# Patient Record
Sex: Female | Born: 1945 | Race: Black or African American | Hispanic: No | Marital: Single | Smoking: Current some day smoker
Health system: Southern US, Community
[De-identification: ages and names within clinical notes are randomized; demographics above are authoritative.]

## PROBLEM LIST (undated history)

## (undated) DIAGNOSIS — E559 Vitamin D deficiency, unspecified: Secondary | ICD-10-CM

## (undated) DIAGNOSIS — M1990 Unspecified osteoarthritis, unspecified site: Secondary | ICD-10-CM

## (undated) HISTORY — PX: WISDOM TOOTH EXTRACTION: SHX21

## (undated) HISTORY — DX: Vitamin D deficiency, unspecified: E55.9

---

## 2003-05-02 ENCOUNTER — Emergency Department (HOSPITAL_COMMUNITY): Admission: AD | Admit: 2003-05-02 | Discharge: 2003-05-02 | Payer: Self-pay | Admitting: Family Medicine

## 2004-05-15 ENCOUNTER — Emergency Department (HOSPITAL_COMMUNITY): Admission: EM | Admit: 2004-05-15 | Discharge: 2004-05-15 | Payer: Self-pay | Admitting: Family Medicine

## 2004-11-07 ENCOUNTER — Emergency Department (HOSPITAL_COMMUNITY): Admission: EM | Admit: 2004-11-07 | Discharge: 2004-11-07 | Payer: Self-pay | Admitting: Emergency Medicine

## 2008-01-26 ENCOUNTER — Emergency Department (HOSPITAL_COMMUNITY): Admission: EM | Admit: 2008-01-26 | Discharge: 2008-01-26 | Payer: Self-pay | Admitting: Emergency Medicine

## 2010-01-09 ENCOUNTER — Emergency Department (HOSPITAL_COMMUNITY)
Admission: EM | Admit: 2010-01-09 | Discharge: 2010-01-09 | Payer: Self-pay | Source: Home / Self Care | Admitting: Family Medicine

## 2010-04-12 ENCOUNTER — Inpatient Hospital Stay (INDEPENDENT_AMBULATORY_CARE_PROVIDER_SITE_OTHER)
Admission: RE | Admit: 2010-04-12 | Discharge: 2010-04-12 | Disposition: A | Discharge status: Home / Self Care | Payer: Medicare Other | Source: Ambulatory Visit | Attending: Family Medicine | Admitting: Family Medicine

## 2010-04-12 DIAGNOSIS — L03019 Cellulitis of unspecified finger: Secondary | ICD-10-CM

## 2010-04-12 DIAGNOSIS — T23029A Burn of unspecified degree of unspecified single finger (nail) except thumb, initial encounter: Secondary | ICD-10-CM

## 2010-04-15 ENCOUNTER — Inpatient Hospital Stay (INDEPENDENT_AMBULATORY_CARE_PROVIDER_SITE_OTHER)
Admission: RE | Admit: 2010-04-15 | Discharge: 2010-04-15 | Disposition: A | Discharge status: Home / Self Care | Payer: Medicare Other | Source: Ambulatory Visit | Attending: Family Medicine | Admitting: Family Medicine

## 2010-04-15 DIAGNOSIS — L0291 Cutaneous abscess, unspecified: Secondary | ICD-10-CM

## 2010-04-15 DIAGNOSIS — T23029A Burn of unspecified degree of unspecified single finger (nail) except thumb, initial encounter: Secondary | ICD-10-CM

## 2012-10-01 ENCOUNTER — Encounter (HOSPITAL_COMMUNITY): Payer: Self-pay

## 2012-10-01 ENCOUNTER — Emergency Department (INDEPENDENT_AMBULATORY_CARE_PROVIDER_SITE_OTHER)
Admission: EM | Admit: 2012-10-01 | Discharge: 2012-10-01 | Disposition: A | Discharge status: Home / Self Care | Payer: Medicare Other | Source: Home / Self Care

## 2012-10-01 DIAGNOSIS — H6120 Impacted cerumen, unspecified ear: Secondary | ICD-10-CM

## 2012-10-01 DIAGNOSIS — H6121 Impacted cerumen, right ear: Secondary | ICD-10-CM

## 2012-10-01 NOTE — ED Provider Notes (Signed)
Medical screening examination/treatment/procedure(s) were performed by non-physician practitioner and as supervising physician I was immediately available for consultation/collaboration.  Abagael Kramm, M.D.  Jenee Spaugh C Aneyah Lortz, MD 10/01/12 1720 

## 2012-10-01 NOTE — ED Notes (Signed)
Episodic ear pain and fullness for several years; NAD

## 2012-10-01 NOTE — ED Provider Notes (Signed)
CSN: [id number]     Arrival date & time 10/01/12  1421 History   First MD Initiated Contact with Patient 10/01/12 1518     Chief Complaint  Patient presents with  . Otalgia   (Consider location/radiation/quality/duration/timing/severity/associated sxs/prior Treatment) HPI Comments: 67 year old female with a history of recurrent ear wax impaction in the right ear presents with complaints of decreased hearing. She states that she has to go to a medical provider every one or more years to clean out her right year. It associated with minor discomfort and some numbness around the ear. The onset of decreased hearing began about one week ago. She denies dizziness, headache or problems with balance.   History reviewed. No pertinent past medical history. History reviewed. No pertinent past surgical history. History reviewed. No pertinent family history. History  Substance Use Topics  . Smoking status: Current Every Day Smoker  . Smokeless tobacco: Not on file  . Alcohol Use: Not on file   OB History   Grav Para Term Preterm Abortions TAB SAB Ect Mult Living                 Review of Systems  HENT: Positive for hearing loss. Negative for congestion, rhinorrhea, mouth sores, trouble swallowing, neck pain, postnasal drip, sinus pressure, tinnitus and ear discharge.   Neurological: Negative for dizziness, tremors, syncope, facial asymmetry, speech difficulty, weakness, light-headedness and headaches.  All other systems reviewed and are negative.    Allergies  Review of patient's allergies indicates no known allergies.  Home Medications  No current outpatient prescriptions on file. BP 134/88  Pulse 88  Temp(Src) 98.4 F (36.9 C) (Oral)  Resp 19  SpO2 96% Physical Exam  Nursing note and vitals reviewed. Constitutional: She is oriented to person, place, and time. She appears well-developed and well-nourished. No distress.  HENT:  Mouth/Throat: Oropharynx is clear and moist. No  oropharyngeal exudate.  Bilateral EACs are obscured with cerumen. No drainage. No tenderness with pulling on the outer ear. No surrounding erythema.  Eyes: Conjunctivae and EOM are normal.  Neck: Normal range of motion. Neck supple.  Cardiovascular: Normal rate.   Pulmonary/Chest: Effort normal. No respiratory distress.  Lymphadenopathy:    She has no cervical adenopathy.  Neurological: She is alert and oriented to person, place, and time. She exhibits normal muscle tone.  Skin: Skin is warm and dry. No rash noted.    ED Course  Procedures (including critical care time) Labs Review Labs Reviewed - No data to display Imaging Review No results found.  MDM   1. Cerumen impaction, right      Post irrigation the EAC is clear. TM minor erythema. St she can now hear normally.  F/U with PCP as needed.   Hayden Rasmussen, NP 10/01/12 1626

## 2013-10-03 ENCOUNTER — Encounter (HOSPITAL_COMMUNITY): Payer: Self-pay | Admitting: Emergency Medicine

## 2013-10-03 ENCOUNTER — Emergency Department (INDEPENDENT_AMBULATORY_CARE_PROVIDER_SITE_OTHER)
Admission: EM | Admit: 2013-10-03 | Discharge: 2013-10-03 | Disposition: A | Discharge status: Home / Self Care | Payer: Medicare Other | Source: Home / Self Care | Attending: Family Medicine | Admitting: Family Medicine

## 2013-10-03 DIAGNOSIS — H6120 Impacted cerumen, unspecified ear: Secondary | ICD-10-CM

## 2013-10-03 DIAGNOSIS — H6123 Impacted cerumen, bilateral: Secondary | ICD-10-CM

## 2013-10-03 NOTE — ED Notes (Signed)
Pt  Reports       Symptoms  Of  Ears   Clogged  For   sev   Weeks    denys       Any other  Symptoms    States  It happens   Every  Year

## 2013-10-03 NOTE — ED Provider Notes (Signed)
CSN: [id number]     Arrival date & time 10/03/13  1046 History   First MD Initiated Contact with Patient 10/03/13 1053     Chief Complaint  Patient presents with  . Cerumen Impaction   (Consider location/radiation/quality/duration/timing/severity/associated sxs/prior Treatment) Patient is a 68 y.o. female presenting with plugged ear sensation. The history is provided by the patient.  Ear Fullness This is a recurrent problem. The current episode started more than 2 days ago. The problem has not changed since onset.Pertinent negatives include no chest pain, no abdominal pain, no headaches and no shortness of breath. Associated symptoms comments: H/o similar problem with cerumen once or twice a year.Marland Kitchen    History reviewed. No pertinent past medical history. History reviewed. No pertinent past surgical history. History reviewed. No pertinent family history. History  Substance Use Topics  . Smoking status: Current Every Day Smoker  . Smokeless tobacco: Not on file  . Alcohol Use: Not on file   OB History   Grav Para Term Preterm Abortions TAB SAB Ect Mult Living                 Review of Systems  Constitutional: Negative.   HENT: Positive for ear pain. Negative for ear discharge and facial swelling.   Respiratory: Negative for shortness of breath.   Cardiovascular: Negative for chest pain.  Gastrointestinal: Negative for abdominal pain.  Neurological: Negative for headaches.    Allergies  Review of patient's allergies indicates no known allergies.  Home Medications   Prior to Admission medications   Not on File   BP 134/86  Pulse 78  Temp(Src) 98.6 F (37 C) (Oral)  Resp 18  SpO2 99% Physical Exam  Nursing note and vitals reviewed. Constitutional: She is oriented to person, place, and time. She appears well-developed and well-nourished.  HENT:  Right Ear: External ear normal.  Left Ear: External ear normal.  Nose: Nose normal.  Mouth/Throat: Oropharynx is clear and  moist.  Eyes: Conjunctivae and EOM are normal. Pupils are equal, round, and reactive to light.  Neck: Normal range of motion. Neck supple.  Lymphadenopathy:    She has no cervical adenopathy.  Neurological: She is alert and oriented to person, place, and time.  Skin: Skin is warm and dry.    ED Course  Procedures (including critical care time) Labs Review Labs Reviewed - No data to display  Imaging Review No results found.   MDM   1. Cerumen impaction, bilateral    Sx improved after irrig, tm nl bilat., canals clear.    Billy Fischer, MD 10/03/13 [phone]

## 2014-11-08 ENCOUNTER — Emergency Department (HOSPITAL_COMMUNITY)
Admission: EM | Admit: 2014-11-08 | Discharge: 2014-11-08 | Disposition: A | Discharge status: Home / Self Care | Payer: Medicare Other | Attending: Emergency Medicine | Admitting: Emergency Medicine

## 2014-11-08 ENCOUNTER — Emergency Department (HOSPITAL_COMMUNITY): Payer: Medicare Other

## 2014-11-08 ENCOUNTER — Encounter (HOSPITAL_COMMUNITY): Payer: Self-pay | Admitting: Emergency Medicine

## 2014-11-08 DIAGNOSIS — Z720 Tobacco use: Secondary | ICD-10-CM | POA: Diagnosis not present

## 2014-11-08 DIAGNOSIS — Y9389 Activity, other specified: Secondary | ICD-10-CM | POA: Insufficient documentation

## 2014-11-08 DIAGNOSIS — X58XXXA Exposure to other specified factors, initial encounter: Secondary | ICD-10-CM | POA: Diagnosis not present

## 2014-11-08 DIAGNOSIS — Y998 Other external cause status: Secondary | ICD-10-CM | POA: Insufficient documentation

## 2014-11-08 DIAGNOSIS — Y9289 Other specified places as the place of occurrence of the external cause: Secondary | ICD-10-CM | POA: Diagnosis not present

## 2014-11-08 DIAGNOSIS — S8992XA Unspecified injury of left lower leg, initial encounter: Secondary | ICD-10-CM | POA: Diagnosis not present

## 2014-11-08 DIAGNOSIS — M179 Osteoarthritis of knee, unspecified: Secondary | ICD-10-CM | POA: Diagnosis not present

## 2014-11-08 DIAGNOSIS — M1710 Unilateral primary osteoarthritis, unspecified knee: Secondary | ICD-10-CM

## 2014-11-08 DIAGNOSIS — M1610 Unilateral primary osteoarthritis, unspecified hip: Secondary | ICD-10-CM

## 2014-11-08 MED ORDER — MELOXICAM 7.5 MG PO TABS: 7.5000 mg | ORAL_TABLET | Freq: Every day | ORAL | Status: AC | PRN

## 2014-11-08 NOTE — ED Notes (Signed)
Per pt, states left knee pain on and off for weeks-states it sometimes starts in groin and radiates to left knee

## 2014-11-08 NOTE — ED Provider Notes (Signed)
CSN: [id number]     Arrival date & time 11/08/14  1147 History  By signing my name below, I, Cheyenne Lucas, attest that this documentation has been prepared under the direction and in the presence of [facility], PA-C. Electronically Signed: Sonum Lucas, Education administrator. 11/08/2014. 12:36 PM.    Chief Complaint  Patient presents with  . Knee Pain   The history is provided by the patient. No language interpreter was used.     HPI Comments: Cheyenne Lucas is a 69 y.o. female who presents to the Emergency Department complaining of intermittent left knee pain for the past 4 weeks. She states the pain worsens with walking and certain movements; states it occasionally makes a "popping" noise. She reports helping her mother out of the car yesterday which also aggravated her pain. 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 She is unsure if the knee and hip pain are related. She denies prior history of PE/DVT. She denies taking hormone replacement therapy, recent long distance travel, or recent immobilizations. She denies weakness, numbness, calf swelling.   History reviewed. No pertinent past medical history. History reviewed. No pertinent past surgical history. No family history on file. Social History  Substance Use Topics  . Smoking status: Current Every Day Smoker  . Smokeless tobacco: None  . Alcohol Use: No   OB History    No data available     Review of Systems  Constitutional: Negative for fever.  Cardiovascular: Negative for leg swelling.  Musculoskeletal: Positive for arthralgias (left hip pain, left knee pain). Negative for myalgias and joint swelling.  Skin: Negative for color change, pallor and wound.  Allergic/Immunologic: Negative for immunocompromised state.  Neurological: Negative for weakness and numbness.  Hematological: Does not bruise/bleed  easily.  Psychiatric/Behavioral: Negative for self-injury.      Allergies  Review of patient's allergies indicates no known allergies.  Home Medications   Prior to Admission medications   Not on File   BP 132/90 mmHg  Pulse 92  Temp(Src) 98.2 F (36.8 C) (Oral)  Resp 18  SpO2 100% Physical Exam  Constitutional: She appears well-developed and well-nourished. No distress.  HENT:  Head: Normocephalic and atraumatic.  Neck: Neck supple.  Cardiovascular: Normal rate.   Pulmonary/Chest: Effort normal.  Musculoskeletal:  Full passive ROM of the left hip and left knee. Pain with abduction of hip.  No laxity of knee joint or pain with movement. No erythema, edema, or warmth. Lower extremities:  Strength 5/5, sensation intact, distal pulses intact.     Neurological: She is alert.  Skin: She is not diaphoretic.  Nursing note and vitals reviewed.   ED Course  Procedures (including critical care time)  DIAGNOSTIC STUDIES: Oxygen Saturation is 100% on RA, normal by my interpretation.    COORDINATION OF CARE: 12:36 PM Discussed treatment plan with pt at bedside and pt agreed to plan.  12:46 PM Dr Eulis Foster made aware of patient, he will also see and examine the patient.    12:55 PM Discussed the case with Dr. Eulis Foster, please see his note for details.    Labs Review Labs Reviewed - No data to display  Imaging Review Dg Knee Complete 4 Views Left  11/08/2014  CLINICAL DATA:  Anterior posterior left knee pain after getting out of bed 26 days ago. EXAM: LEFT KNEE - COMPLETE 4+ VIEW  COMPARISON:  None. FINDINGS: Minimal spur formation involving all 3 joint compartments. No fracture, dislocation or effusion. IMPRESSION: Minimal degenerative changes. Electronically Signed   By: Claudie Revering M.D.   On: 11/08/2014 12:41   Dg Hip Unilat With Pelvis 2-3 Views Left  11/08/2014  CLINICAL DATA:  Chronic left hip pain. EXAM: DG HIP (WITH OR WITHOUT PELVIS) 2-3V LEFT COMPARISON:  None. FINDINGS:  Mild to moderate left hip spur formation and joint space narrowing. Oval calcification in the upper left pelvis. IMPRESSION: 1. 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 2. Probable calcified uterine fibroid. Electronically Signed   By: Claudie Revering M.D.   On: 11/08/2014 13:40      EKG Interpretation None         MDM   Final diagnoses:  Arthritis, hip  Arthritis of knee   Afebrile, nontoxic patient with chronic left hip pain, worse with movement and certain position, new left knee pain, also worse with certain positions and movement.  Neurovascularly intact.  No edema or changes concerning for DVT.  Also no known risk factors for DVT.  Xrays show degenerative changes.  Pt also seen by Dr Eulis Foster, please see his note for further details.  D/C home with mobic, PCP follow up.  PCP [facility], appt scheduled for next week.  Discussed result, findings, treatment, and follow up  with patient.  Pt given return precautions.  Pt verbalizes understanding and agrees with plan.       I personally performed the services described in this documentation, which was scribed in my presence. The recorded information has been reviewed and is accurate.   Clayton Bibles, PA-C 11/08/14 1500  Daleen Bo, MD 11/08/14 1700

## 2014-11-08 NOTE — ED Notes (Signed)
Patient transported to X-ray 

## 2014-11-08 NOTE — Discharge Instructions (Signed)
Read the information below.  Use the prescribed medication as directed.  Please discuss all new medications with your pharmacist.  You may return to the Emergency Department at any time for worsening condition or any new symptoms that concern you.  If you develop uncontrolled pain, weakness or numbness of the extremity, severe discoloration of the skin, or you are unable to walk, return to the ER for a recheck.      Arthritis Arthritis is a term that is commonly used to refer to joint pain or joint disease. There are more than 100 types of arthritis. CAUSES The most common cause of this condition is wear and tear of a joint. Other causes include:  Gout.  Inflammation of a joint.  An infection of a joint.  Sprains and other injuries near the joint.  A drug reaction or allergic reaction. In some cases, the cause may not be known. SYMPTOMS The main symptom of this condition is pain in the joint with movement. Other symptoms include:  Redness, swelling, or stiffness at a joint.  Warmth coming from the joint.  Fever.  Overall feeling of illness. DIAGNOSIS This condition may be diagnosed with a physical exam and tests, including:  Blood tests.  Urine tests.  Imaging tests, such as MRI, X-rays, or a CT scan. Sometimes, fluid is removed from a joint for testing. TREATMENT Treatment for this condition may involve:  Treatment of the cause, if it is known.  Rest.  Raising (elevating) the joint.  Applying cold or hot packs to the joint.  Medicines to improve symptoms and reduce inflammation.  Injections of a steroid such as cortisone into the joint to help reduce pain and inflammation. Depending on the cause of your arthritis, you may need to make lifestyle changes to reduce stress on your joint. These changes may include exercising more and losing weight. HOME CARE INSTRUCTIONS Medicines  Take over-the-counter and prescription medicines only as told by your health care  provider.  Do not take aspirin to relieve pain if gout is suspected. Activities  Rest your joint if told by your health care provider. Rest is important when your disease is active and your joint feels painful, swollen, or stiff.  Avoid activities that make the pain worse. It is important to balance activity with rest.  Exercise your joint regularly with range-of-motion exercises as told by your health care provider. Try doing low-impact exercise, such as:  Swimming.  Water aerobics.  Biking.  Walking. Joint Care  If your joint is swollen, keep it elevated if told by your health care provider.  If your joint feels stiff in the morning, try taking a warm shower.  If directed, apply heat to the joint. If you have diabetes, do not apply heat without permission from your health care provider.  Put a towel between the joint and the hot pack or heating pad.  Leave the heat on the area for 20-30 minutes.  If directed, apply ice to the joint:  Put ice in a plastic bag.  Place a towel between your skin and the bag.  Leave the ice on for 20 minutes, 2-3 times per day.  Keep all follow-up visits as told by your health care provider. This is important. SEEK MEDICAL CARE IF:  The pain gets worse.  You have a fever. SEEK IMMEDIATE MEDICAL CARE IF:  You develop severe joint pain, swelling, or redness.  Many joints become painful and swollen.  You develop severe back pain.  You develop severe  weakness in your leg.  You cannot control your bladder or bowels.   This information is not intended to replace advice given to you by your health care provider. Make sure you discuss any questions you have with your health care provider.   Document Released: 02/09/2004 Document Revised: 09/22/2014 Document Reviewed: 03/29/2014 Elsevier Interactive Patient Education [employer].

## 2014-11-08 NOTE — ED Provider Notes (Signed)
  Face-to-face evaluation   History: Patient with history of left knee pain for 4 weeks after stepping wrong getting out of bed. The pain is intermittent and worse with certain positions of her left leg. She also has chronic, ongoing left groin pain, for many years, and some pain in her hip with range of motion.  Physical exam: Alert, elderly female in mild pain. Left knee without significant swelling. It is grossly stable to stress. No significant left knee tenderness.  Medical screening examination/treatment/procedure(s) were conducted as a shared visit with non-physician practitioner(s) and myself.  I personally evaluated the patient during the encounter  Daleen Bo, MD 11/08/14 1701

## 2014-11-18 ENCOUNTER — Encounter: Payer: Self-pay | Admitting: Gastroenterology

## 2014-11-25 ENCOUNTER — Other Ambulatory Visit: Payer: Self-pay | Admitting: Internal Medicine

## 2014-11-25 DIAGNOSIS — E2839 Other primary ovarian failure: Secondary | ICD-10-CM

## 2014-11-26 ENCOUNTER — Other Ambulatory Visit: Payer: Self-pay

## 2014-11-26 DIAGNOSIS — Z1231 Encounter for screening mammogram for malignant neoplasm of breast: Secondary | ICD-10-CM

## 2015-01-14 ENCOUNTER — Ambulatory Visit
Admission: RE | Admit: 2015-01-14 | Discharge: 2015-01-14 | Disposition: A | Discharge status: Home / Self Care | Payer: Medicare Other | Source: Ambulatory Visit

## 2015-01-14 ENCOUNTER — Ambulatory Visit
Admission: RE | Admit: 2015-01-14 | Discharge: 2015-01-14 | Disposition: A | Discharge status: Home / Self Care | Payer: Medicare Other | Source: Ambulatory Visit | Attending: Internal Medicine | Admitting: Internal Medicine

## 2015-01-14 DIAGNOSIS — Z1231 Encounter for screening mammogram for malignant neoplasm of breast: Secondary | ICD-10-CM

## 2015-01-14 DIAGNOSIS — E2839 Other primary ovarian failure: Secondary | ICD-10-CM

## 2015-01-28 ENCOUNTER — Ambulatory Visit (AMBULATORY_SURGERY_CENTER): Payer: Self-pay

## 2015-01-28 VITALS — Ht 61.0 in | Wt 198.0 lb

## 2015-01-28 DIAGNOSIS — Z800 Family history of malignant neoplasm of digestive organs: Secondary | ICD-10-CM

## 2015-01-28 MED ORDER — SUPREP BOWEL PREP KIT 17.5-3.13-1.6 GM/177ML PO SOLN: 1.0000 | Freq: Once | ORAL | Status: DC

## 2015-01-28 NOTE — Progress Notes (Signed)
No allergies to eggs or soy No past problems with anesthesia No diet/weight loss meds No home oxygen  Has email and internet; registered for emmi

## 2015-02-04 ENCOUNTER — Encounter: Payer: Self-pay | Admitting: Gastroenterology

## 2015-02-04 ENCOUNTER — Ambulatory Visit (AMBULATORY_SURGERY_CENTER): Payer: Medicare Other | Admitting: Gastroenterology

## 2015-02-04 VITALS — BP 184/99 | HR 79 | Temp 98.4°F | Resp 24 | Ht 60.0 in | Wt 198.0 lb

## 2015-02-04 DIAGNOSIS — Z800 Family history of malignant neoplasm of digestive organs: Secondary | ICD-10-CM

## 2015-02-04 DIAGNOSIS — D124 Benign neoplasm of descending colon: Secondary | ICD-10-CM

## 2015-02-04 DIAGNOSIS — Z1211 Encounter for screening for malignant neoplasm of colon: Secondary | ICD-10-CM | POA: Diagnosis present

## 2015-02-04 MED ORDER — SODIUM CHLORIDE 0.9 % IV SOLN: 500.0000 mL | INTRAVENOUS | Status: DC

## 2015-02-04 NOTE — Op Note (Signed)
[facility]  [address] [place], [id number]   COLONOSCOPY PROCEDURE REPORT  PATIENT: Cheyenne Lucas, Cheyenne Lucas  MR#: [id number] BIRTHDATE: 25-Jun-1945 , 69  yrs. old GENDER: female ENDOSCOPIST: Harl Bowie, MD REFERRED [id number] Jeanie Cooks, M.D. PROCEDURE DATE:  02/04/2015 PROCEDURE:   Colonoscopy, screening and Colonoscopy with snare polypectomy First Screening Colonoscopy - Avg.  risk and is 50 yrs.  old or older Yes.  Prior Negative Screening - Now for repeat screening. N/A  History of Adenoma - Now for follow-up colonoscopy & has been > or = to 3 yrs.  N/A  Polyps removed today? Yes ASA CLASS:   Class II INDICATIONS:Screening for colonic neoplasia and Colorectal Neoplasm Risk Assessment for this procedure is average risk. MEDICATIONS: Propofol 150 mg IV  DESCRIPTION OF PROCEDURE:   After the risks benefits and alternatives of the procedure were thoroughly explained, informed consent was obtained.  The digital rectal exam revealed no abnormalities of the rectum.   The LB [id number]  endoscope was introduced through the anus and advanced to the terminal ileum which was intubated for a short distance. No adverse events experienced.   The quality of the prep was good.  The instrument was then slowly withdrawn as the colon was fully examined. Estimated blood loss is zero unless otherwise noted in this procedure report.   COLON FINDINGS: There was moderate diverticulosis noted in the sigmoid colon and descending colon.   A sessile polyp ranging between 5-41mm in size was found in the descending colon.  A polypectomy was performed with a cold snare.  The resection was complete, the polyp tissue was completely retrieved and sent to histology.  Retroflexed views revealed no abnormalities. The time to cecum = 3.2 Withdrawal time = 8.0   The scope was withdrawn and the procedure completed. COMPLICATIONS: There were no immediate complications.  ENDOSCOPIC  IMPRESSION: 1.   There was moderate diverticulosis noted in the sigmoid colon and descending colon 2.   Sessile polyp ranging between 5-52mm in size was found in the descending colon; polypectomy was performed with a cold snare  RECOMMENDATIONS: If the polyp(s) removed today are proven to be adenomatous (pre-cancerous) polyps, you will need a repeat colonoscopy in 5 years.  Otherwise you should continue to follow colorectal cancer screening guidelines for "routine risk" patients with colonoscopy in 10 years.  You will receive a letter within 1-2 weeks with the results of your biopsy as well as final recommendations.  Please call my office if you have not received a letter after 3 weeks.  eSigned:  Harl Bowie, MD 02/04/2015 10:49 AM

## 2015-02-04 NOTE — Patient Instructions (Signed)
YOU HAD AN ENDOSCOPIC PROCEDURE TODAY AT THE [facility]:   Refer to the procedure report that was given to you for any specific questions about what was found during the examination.  If the procedure report does not answer your questions, please call your gastroenterologist to clarify.  If you requested that your care partner not be given the details of your procedure findings, then the procedure report has been included in a sealed envelope for you to review at your convenience later.  YOU SHOULD EXPECT: Some feelings of bloating in the abdomen. Passage of more gas than usual.  Walking can help get rid of the air that was put into your GI tract during the procedure and reduce the bloating. If you had a lower endoscopy (such as a colonoscopy or flexible sigmoidoscopy) you may notice spotting of blood in your stool or on the toilet paper. If you underwent a bowel prep for your procedure, you may not have a normal bowel movement for a few days.  Please Note:  You might notice some irritation and congestion in your nose or some drainage.  This is from the oxygen used during your procedure.  There is no need for concern and it should clear up in a day or so.  SYMPTOMS TO REPORT IMMEDIATELY:   Following lower endoscopy (colonoscopy or flexible sigmoidoscopy):  Excessive amounts of blood in the stool  Significant tenderness or worsening of abdominal pains  Swelling of the abdomen that is new, acute  Fever of 100F or higher    For urgent or emergent issues, a gastroenterologist can be reached at any hour by calling [phone].   DIET: Your first meal following the procedure should be a small meal and then it is ok to progress to your normal diet. Heavy or fried foods are harder to digest and may make you feel nauseous or bloated.  Likewise, meals heavy in dairy and vegetables can increase bloating.  Drink plenty of fluids but you should avoid alcoholic beverages for 24  hours.  ACTIVITY:  You should plan to take it easy for the rest of today and you should NOT DRIVE or use heavy machinery until tomorrow (because of the sedation medicines used during the test).    FOLLOW UP: Our staff will call the number listed on your records the next business day following your procedure to check on you and address any questions or concerns that you may have regarding the information given to you following your procedure. If we do not reach you, we will leave a message.  However, if you are feeling well and you are not experiencing any problems, there is no need to return our call.  We will assume that you have returned to your regular daily activities without incident.  If any biopsies were taken you will be contacted by phone or by letter within the next 1-3 weeks.  Please call us at [phone] if you have not heard about the biopsies in 3 weeks.    SIGNATURES/CONFIDENTIALITY: You and/or your care partner have signed paperwork which will be entered into your electronic medical record.  These signatures attest to the fact that that the information above on your After Visit Summary has been reviewed and is understood.  Full responsibility of the confidentiality of this discharge information lies with you and/or your care-partner.   Information on polyps and diverticulosis given to you today 

## 2015-02-04 NOTE — Progress Notes (Signed)
To recovery, report to [place], RN, VSS 

## 2015-02-04 NOTE — Progress Notes (Signed)
Called to room to assist during endoscopic procedure.  Patient ID and intended procedure confirmed with present staff. Received instructions for my participation in the procedure from the performing physician.  

## 2015-02-07 ENCOUNTER — Ambulatory Visit (INDEPENDENT_AMBULATORY_CARE_PROVIDER_SITE_OTHER): Payer: Medicare Other | Admitting: Pulmonary Disease

## 2015-02-07 ENCOUNTER — Encounter: Payer: Self-pay | Admitting: Pulmonary Disease

## 2015-02-07 ENCOUNTER — Telehealth: Payer: Self-pay | Admitting: Emergency Medicine

## 2015-02-07 VITALS — BP 142/88 | HR 94 | Ht 60.0 in | Wt 199.6 lb

## 2015-02-07 DIAGNOSIS — G4733 Obstructive sleep apnea (adult) (pediatric): Secondary | ICD-10-CM | POA: Diagnosis not present

## 2015-02-07 DIAGNOSIS — R0683 Snoring: Secondary | ICD-10-CM | POA: Diagnosis not present

## 2015-02-07 DIAGNOSIS — Z716 Tobacco abuse counseling: Secondary | ICD-10-CM | POA: Insufficient documentation

## 2015-02-07 DIAGNOSIS — IMO0001 Reserved for inherently not codable concepts without codable children: Secondary | ICD-10-CM

## 2015-02-07 DIAGNOSIS — F17200 Nicotine dependence, unspecified, uncomplicated: Secondary | ICD-10-CM

## 2015-02-07 NOTE — Patient Instructions (Addendum)
Home sleep study Breathing test appears ok - You have to QUIT smoking !

## 2015-02-07 NOTE — Assessment & Plan Note (Signed)
Smoking cessation was encouraged  I used spirometry  Findings to motivate her  we discussed screening CT scan for lung cancer- detail about risks and benefits

## 2015-02-07 NOTE — Progress Notes (Signed)
Subjective:    Patient ID: Cheyenne Lucas, female    DOB: 1945/05/12, 70 y.o.   MRN: [id number]  HPI  Chief Complaint  Patient presents with  . SLEEP CONSULT    Self-Referred. Pt c/o snoring and reports witnessed apneas. Pt states that she even wakes herself up snoring.      70 year old smoker presents for evaluation of sleep-disordered breathing.  She is also concerned about her risk for lung cancer.  Her husband died of advanced COPD-  And he had a CPAP machine.  She has tried using this machine. She has been told by her friend that she snores very loudly and stops breathing in her sleep. She often wakes herself up with her loud snoring.  Epworth sleepiness score is 8  She denies sleepiness but reports excessive daytime fatigue and states that she is always tired. Bedtime is between 11 PM and midnight, sleep latency is a few minutes, she sleeps on her side with one pillow, reports 2-3 nocturnal awakenings and is out of bed by 6:30 AM feeling tired without dryness of mouth or headaches. She drinks coffee in the morning. She's gained about 10 pounds in the last year.  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  She lives with her mother who is 36 years old.   Spirometry >> nml ratio, FEV1 94%, poor effort  Past Medical History  Diagnosis Date  . Vitamin D deficiency     Past Surgical History  Procedure Laterality Date  . Cesarean section    . Wisdom tooth extraction      no anesthesia    No Known Allergies  Social History   Social History  . Marital Status: Single    Spouse Name: N/A  . Number of Children: N/A  . Years of Education: N/A   Occupational History  . Not on file.   Social History Main Topics  . Smoking status: Current Every Day Smoker  . Smokeless tobacco: Never Used  . Alcohol Use: No  .  Drug Use: No  . Sexual Activity: Not on file   Other Topics Concern  . Not on file   Social History Narrative     Family History  Problem Relation Age of Onset  . Colon cancer Paternal Grandmother     Review of Systems  Constitutional: Negative for fever and unexpected weight change.  HENT: Negative.  Negative for congestion, dental problem, ear pain, nosebleeds, postnasal drip, rhinorrhea, sinus pressure, sneezing, sore throat and trouble swallowing.   Eyes: Negative.  Negative for redness and itching.  Respiratory: Positive for shortness of breath. Negative for cough, chest tightness and wheezing.   Cardiovascular: Negative.  Negative for palpitations and leg swelling.  Gastrointestinal: Negative.  Negative for nausea and vomiting.  Endocrine: Negative.   Genitourinary: Negative.  Negative for dysuria.  Musculoskeletal: Positive for arthralgias. Negative for joint swelling.  Skin: Negative.  Negative for rash.  Allergic/Immunologic: Negative.   Neurological: Negative.  Negative for headaches.  Hematological: Negative.  Does not bruise/bleed easily.  Psychiatric/Behavioral: Negative.  Negative for dysphoric mood. The patient is not nervous/anxious.        Objective:   Physical Exam  Gen. Pleasant, obese, in no distress ENT - no lesions, no post nasal drip, class 2 airway Neck: No JVD, no thyromegaly, no carotid  bruits Lungs: no use of accessory muscles, no dullness to percussion, decreased without rales or rhonchi  Cardiovascular: Rhythm regular, heart sounds  normal, no murmurs or gallops, no peripheral edema Musculoskeletal: No deformities, no cyanosis or clubbing , no tremors       Assessment & Plan:

## 2015-02-07 NOTE — Telephone Encounter (Signed)
  Follow up Call-  Call back number 02/04/2015  Post procedure Call Back phone  # [phone]  Permission to leave phone message Yes     Patient questions:  Do you have a fever, pain , or abdominal swelling? No. Pain Score  0 *  Have you tolerated food without any problems? Yes.    Have you been able to return to your normal activities? Yes.    Do you have any questions about your discharge instructions: Diet   No. Medications  No. Follow up visit  No.  Do you have questions or concerns about your Care? No.  Actions: * If pain score is 4 or above: No action needed, pain <4.

## 2015-02-07 NOTE — Assessment & Plan Note (Signed)
Given excessive daytime somnolence, witnessed apneas & loud snoring, obstructive sleep apnea is very likely & an overnight polysomnogram will be scheduled as a home study. The pathophysiology of obstructive sleep apnea , it's cardiovascular consequences & modes of treatment including CPAP were discused with the patient in detail & they evidenced understanding.  Pretest probability is high  She needs a CPAP machine, she would like to use the machine that she already has

## 2015-02-16 ENCOUNTER — Encounter: Payer: Self-pay | Admitting: Gastroenterology

## 2015-02-17 DIAGNOSIS — G4733 Obstructive sleep apnea (adult) (pediatric): Secondary | ICD-10-CM | POA: Diagnosis not present

## 2015-02-18 ENCOUNTER — Telehealth: Payer: Self-pay | Admitting: Pulmonary Disease

## 2015-02-18 DIAGNOSIS — G4733 Obstructive sleep apnea (adult) (pediatric): Secondary | ICD-10-CM | POA: Diagnosis not present

## 2015-02-18 NOTE — Telephone Encounter (Signed)
Sleep Study Results - 02/17/15  Per Dr. Elsworth Soho:  AHI: 26/hr CPAP Titration study needed

## 2015-02-21 ENCOUNTER — Other Ambulatory Visit: Payer: Self-pay | Admitting: *Deleted

## 2015-02-21 DIAGNOSIS — R0683 Snoring: Secondary | ICD-10-CM

## 2015-02-25 NOTE — Telephone Encounter (Signed)
Pt is aware of results. Order will be placed for CPAP titration study.

## 2015-03-12 ENCOUNTER — Encounter (HOSPITAL_BASED_OUTPATIENT_CLINIC_OR_DEPARTMENT_OTHER): Payer: Medicare Other

## 2015-03-25 ENCOUNTER — Other Ambulatory Visit: Payer: Self-pay | Admitting: Acute Care

## 2015-03-25 DIAGNOSIS — F17210 Nicotine dependence, cigarettes, uncomplicated: Principal | ICD-10-CM

## 2015-03-26 ENCOUNTER — Ambulatory Visit (HOSPITAL_BASED_OUTPATIENT_CLINIC_OR_DEPARTMENT_OTHER): Payer: Medicare Other | Attending: Pulmonary Disease

## 2015-03-26 VITALS — Ht 62.0 in | Wt 190.0 lb

## 2015-03-26 DIAGNOSIS — R0683 Snoring: Secondary | ICD-10-CM | POA: Diagnosis not present

## 2015-03-26 DIAGNOSIS — G4730 Sleep apnea, unspecified: Secondary | ICD-10-CM | POA: Diagnosis present

## 2015-03-26 DIAGNOSIS — G4761 Periodic limb movement disorder: Secondary | ICD-10-CM | POA: Insufficient documentation

## 2015-03-26 DIAGNOSIS — G4733 Obstructive sleep apnea (adult) (pediatric): Secondary | ICD-10-CM

## 2015-03-31 ENCOUNTER — Telehealth: Payer: Self-pay | Admitting: Pulmonary Disease

## 2015-03-31 DIAGNOSIS — G4733 Obstructive sleep apnea (adult) (pediatric): Secondary | ICD-10-CM | POA: Diagnosis not present

## 2015-03-31 NOTE — Telephone Encounter (Signed)
Send prescription for- CPAP therapy on 10 cm H2O with a Large size Philips Respironics Nasal Mask Wisp Mask mask and heated humidification.  Download in 4 weeks and office visit in 6 with TP/ me

## 2015-03-31 NOTE — Progress Notes (Signed)
Patient Name: Cheyenne Lucas, Cheyenne Lucas Date: 03/26/2015 Gender: Female D.O.B: 19-Apr-1945 Age (years): 73 Referring Provider: Kara Mead MD, ABSM Height (inches): 62 Interpreting Physician: Kara Mead MD, ABSM Weight (lbs): 190 RPSGT: Duwayne Heck BMI: 35 MRN: [id number] Neck Size: 14.50   CLINICAL INFORMATION The patient is referred for a CPAP titration to treat sleep apnea. Date of  HST: 02/2015 AHI: 26/hr   SLEEP STUDY TECHNIQUE As per the AASM Manual for the Scoring of Sleep and Associated Events v2.3 (April 2016) with a hypopnea requiring 4% desaturations. The channels recorded and monitored were frontal, central and occipital EEG, electrooculogram (EOG), submentalis EMG (chin), nasal and oral airflow, thoracic and abdominal wall motion, anterior tibialis EMG, snore microphone, electrocardiogram, and pulse oximetry. Continuous positive airway pressure (CPAP) was initiated at the beginning of the study and titrated to treat sleep-disordered breathing.   RESPIRATORY PARAMETERS Optimal PAP Pressure (cm): 10 AHI at Optimal Pressure (/hr): 0.0 Overall Minimal O2 (%): 89.00 Supine % at Optimal Pressure (%): 0 Minimal O2 at Optimal Pressure (%): 93.0     SLEEP ARCHITECTURE The study was initiated at 10:59:40 PM and ended at 4:56:02 AM. Sleep onset time was 8.3 minutes and the sleep efficiency was 90.4%. The total sleep time was 322.1 minutes. The patient spent 3.42% of the night in stage N1 sleep, 79.82% in stage N2 sleep, 2.79% in stage N3 and 13.97% in REM.Stage REM latency was 221.0 minutes Wake after sleep onset was 26.0. Alpha intrusion was absent. Supine sleep was 31.20%.   CARDIAC DATA The 2 lead EKG demonstrated sinus rhythm. The mean heart rate was 79.93 beats per minute. Other EKG findings include: None.   LEG MOVEMENT DATA The total Periodic Limb Movements of Sleep (PLMS) were 315. The PLMS index was 58.68. A PLMS index of <15 is considered normal in  adults.   IMPRESSIONS - The optimal PAP pressure was 10 cm of water. - Central sleep apnea was not noted during this titration (CAI = 0.0/h). - Mild oxygen desaturations were observed during this titration (min O2 = 89.00%). - The patient snored with Moderate snoring volume during this titration study. - No cardiac abnormalities were observed during this study. 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 Arousals associated with PLMs were significant.   DIAGNOSIS - Obstructive Sleep Apnea (327.23 [G47.33 ICD-10])   RECOMMENDATIONS - Trial of CPAP therapy on 10 cm H2O with a Large size Philips Respironics Nasal Mask Wisp Mask mask and heated humidification. - Avoid alcohol, sedatives and other CNS depressants that may worsen sleep apnea and disrupt normal sleep architecture. - Sleep hygiene should be reviewed to assess factors that may improve sleep quality. - Weight management and regular exercise should be initiated or continued. - Return to [facility] for re-evaluation after 4 weeks of therapy  Kara Mead MD. FCCP. [facility]   03/31/2015

## 2015-04-04 ENCOUNTER — Ambulatory Visit (INDEPENDENT_AMBULATORY_CARE_PROVIDER_SITE_OTHER)
Admission: RE | Admit: 2015-04-04 | Discharge: 2015-04-04 | Disposition: A | Discharge status: Home / Self Care | Payer: Medicare Other | Source: Ambulatory Visit | Attending: Acute Care | Admitting: Acute Care

## 2015-04-04 ENCOUNTER — Ambulatory Visit (INDEPENDENT_AMBULATORY_CARE_PROVIDER_SITE_OTHER): Payer: Medicare Other | Admitting: Acute Care

## 2015-04-04 ENCOUNTER — Encounter: Payer: Self-pay | Admitting: Acute Care

## 2015-04-04 DIAGNOSIS — F17210 Nicotine dependence, cigarettes, uncomplicated: Secondary | ICD-10-CM | POA: Diagnosis not present

## 2015-04-04 NOTE — Telephone Encounter (Signed)
[phone], pt cb

## 2015-04-04 NOTE — Telephone Encounter (Signed)
Patient is here for a 2:00 pm appt with Judson Roch. She asked about not receiving a call back from [place] and I informed her that her mailbox was full and we tried to call.  She is clearing out her VM now, so should be able to call and leave a msg.  She is in the office now with Judson Roch.

## 2015-04-04 NOTE — Progress Notes (Signed)
Shared Decision Making Visit Lung Cancer Screening Program [id number])   Eligibility:  Age 70 y.o.  Pack Years Smoking History Calculation 51 pack years (# packs/per year x # years smoked)  Recent History of coughing up blood  no  Unexplained weight loss? no ( >Than 15 pounds within the last 6 months )  Prior History Lung / other cancer no (Diagnosis within the last 5 years already requiring surveillance chest CT Scans).  Smoking Status Current Smoker  Former Smokers: Years since quit: Current smoker  Quit Date: Not applicable  Visit Components:  Discussion included one or more decision making aids. yes  Discussion included risk/benefits of screening. yes  Discussion included potential follow up diagnostic testing for abnormal scans. yes  Discussion included meaning and risk of over diagnosis. yes  Discussion included meaning and risk of False Positives. yes  Discussion included meaning of total radiation exposure. yes  Counseling Included:  Importance of adherence to annual lung cancer LDCT screening. yes  Impact of comorbidities on ability to participate in the program. yes  Ability and willingness to under diagnostic treatment. yes  Smoking Cessation Counseling:  Current Smokers:   Discussed importance of smoking cessation. yes  Information about tobacco cessation classes and interventions provided to patient. yes  Patient provided with "ticket" for LDCT Scan. yes  Symptomatic Patient. no  Counseling : not applicable  Diagnosis Code: Tobacco Use Z72.0  Asymptomatic Patient yes  Counseling (Intermediate counseling: > three minutes counseling) [id number]  Former Smokers:   Discussed the importance of maintaining cigarette abstinence.NA, current smoker  Diagnosis Code: Personal History of Nicotine Dependence. [id number]  Information about tobacco cessation classes and interventions provided to patient. Yes  Patient provided with "ticket" for LDCT Scan.  yes  Written Order for Lung Cancer Screening with LDCT placed in Epic. Yes (CT Chest Lung Cancer Screening Low Dose W/O CM) [id number] Z12.2-Screening of respiratory organs Z87.891-Personal history of nicotine dependence  I have spent 20 minutes of face to face time with Cheyenne Lucas discussing the risks and benefits of lung cancer screening. We viewed a power point together that explained in detail the above noted topics. We paused at intervals to allow for questions to be asked and answered to ensure understanding.We discussed that the single most powerful action that she can take to decrease her risk of developing lung cancer is to quit smoking. We discussed whether or not she is ready to commit to setting a quit date. She is currently not ready to commit to a quit date. We discussed options for tools to aid in quitting smoking including nicotine replacement therapy, non-nicotine medications, support groups, Quit Smart classes, and behavior modification. We discussed that often times setting smaller, more achievable goals, such as eliminating 1 cigarette a day for a week and then 2 cigarettes a day for a week can be helpful in slowly decreasing the number of cigarettes smoked. This allows for a sense of accomplishment as well as providing a clinical benefit. I gave her the " Be Stronger Than Your Excuses" card with contact information for community resources, classes, free nicotine replacement therapy, and access to mobile apps, text messaging, and on-line smoking cessation help. I have also given her my card and contact information in the event she needs to contact me. We discussed the time and location of the scan, and that either June Leap, CMA, or I will call with the results within 24-48 hours of receiving them. I have provided Cheyenne Lucas with a copy of  the power point we viewed  as a resource in the event they need reinforcement of the concepts we discussed today in the office. The patient  verbalized understanding of all of  the above and had no further questions upon leaving the office. They have my contact information in the event they have any further questions.   Magdalen Spatz, NP  04/04/2015

## 2015-04-04 NOTE — Telephone Encounter (Signed)
lmtcb for pt.  

## 2015-04-04 NOTE — Telephone Encounter (Signed)
Pt scheduled for appt with TP 05/09/15 for follow up CPAP Order placed for CPAP machine/supplies.  Nothing further needed.

## 2015-04-04 NOTE — Telephone Encounter (Signed)
Attempted to contact patient, unable to leave message, mailbox full, will call back.

## 2015-05-03 ENCOUNTER — Ambulatory Visit (HOSPITAL_BASED_OUTPATIENT_CLINIC_OR_DEPARTMENT_OTHER): Payer: Medicare Other

## 2015-05-09 ENCOUNTER — Ambulatory Visit (INDEPENDENT_AMBULATORY_CARE_PROVIDER_SITE_OTHER): Payer: Medicare Other | Admitting: Adult Health

## 2015-05-09 ENCOUNTER — Encounter: Payer: Self-pay | Admitting: Adult Health

## 2015-05-09 VITALS — BP 124/86 | HR 91 | Temp 98.6°F | Ht 62.0 in | Wt 207.0 lb

## 2015-05-09 DIAGNOSIS — G4733 Obstructive sleep apnea (adult) (pediatric): Secondary | ICD-10-CM

## 2015-05-09 NOTE — Assessment & Plan Note (Signed)
Moderate OSA with good control on CPAP  Encouraged on CPAP compliance.   Plan  May try nasal pillows in place of nasal mask.  Keep up good work Saline nasal spray and gel As needed   Try to wear for 4-6 hours each night.  Follow up in 4 months with Dr. Elsworth Soho  And As needed   Do not drive if sleepy .  Work on weight loss.

## 2015-05-09 NOTE — Progress Notes (Signed)
Subjective:    Patient ID: Cheyenne Lucas, female    DOB: 30-Mar-1945, 70 y.o.   MRN: [id number]  HPI 70 yo smoker seen for sleep consult 02/2015 found to have moderate OSA   TEST  02/2015 HST AHI 26/hr  CPAP titration with good control at 10cm H2O .    05/09/2015 Follow up : OSA  Pt returns for 3 month  follow up .  She was recently for sleep consult.  Dx with moderate OSA on HST .  CPAP titration study showed good control .  She started on CPAP last 4 weeks . Marland Kitchen Would like to discuss a new mask.  Says she is trying to wear each night but has to take off during night . Has stuffy nose. Wants to change to nasal pillows. Feels the nasal mask pops off when she turns.  Cares for mom that is 101 at her home, has to check on her at night.  She does feel more rested since starting this  Download shows good compliance with 3.5hr each night . Good control with AHI at 0.6  , + leaks.  She denies chest pain, orthopnea, edema or fever.    Past Medical History  Diagnosis Date  . Vitamin D deficiency     Current Outpatient Prescriptions on File Prior to Visit  Medication Sig Dispense Refill  . ergocalciferol (VITAMIN D2) 50000 units capsule Take 50,000 Units by mouth once a week.    . meloxicam (MOBIC) 7.5 MG tablet Take 1 tablet (7.5 mg total) by mouth daily as needed for pain. 10 tablet 0   No current facility-administered medications on file prior to visit.      Review of Systems Constitutional:   No  weight loss, night sweats,  Fevers, chills, fatigue, or  lassitude.  HEENT:   No headaches,  Difficulty swallowing,  Tooth/dental problems, or  Sore throat,                No sneezing, itching, ear ache, nasal congestion, post nasal drip,   CV:  No chest pain,  Orthopnea, PND, swelling in lower extremities, anasarca, dizziness, palpitations, syncope.   GI  No heartburn, indigestion, abdominal pain, nausea, vomiting, diarrhea, change in bowel habits, loss of appetite, bloody stools.    Resp:  No chest wall deformity  Skin: no rash or lesions.  GU: no dysuria, change in color of urine, no urgency or frequency.  No flank pain, no hematuria   MS:  No joint pain or swelling.  No decreased range of motion.  No back pain.  Psych:  No change in mood or affect. No depression or anxiety.  No memory loss.         Objective:   Physical Exam Filed Vitals:   05/09/15 1524  BP: 124/86  Pulse: 91  Temp: 98.6 F (37 C)  TempSrc: Oral  Height: 5\' 2"  (1.575 m)  Weight: 207 lb (93.895 kg)  SpO2: 96%  Body mass index is 37.85 kg/(m^2).   GEN: A/Ox3; pleasant , NAD, obese    HEENT:  [place]/AT,  EACs-clear, TMs-wnl, NOSE-clear, THROAT-clear, no lesions, no postnasal drip or exudate noted. Class 2-3 MP airway   NECK:  Supple w/ fair ROM; no JVD; normal carotid impulses w/o bruits; no thyromegaly or nodules palpated; no lymphadenopathy.  RESP  Clear  P & A; w/o, wheezes/ rales/ or rhonchi.no accessory muscle use, no dullness to percussion  CARD:  RRR, no m/r/g  , no peripheral edema, pulses  intact, no cyanosis or clubbing.  GI:   Soft & nt; nml bowel sounds; no organomegaly or masses detected.  Musco: Warm bil, no deformities or joint swelling noted.   Neuro: alert, no focal deficits noted.    Skin: Warm, no lesions or rashes  Valori Hollenkamp NP-C  [facility]  05/09/2015       Assessment & Plan:

## 2015-05-09 NOTE — Patient Instructions (Addendum)
May try nasal pillows in place of nasal mask.  Keep up good work Saline nasal spray and gel As needed   Try to wear for 4-6 hours each night.  Follow up in 4 months with Dr. Elsworth Soho  And As needed   Do not drive if sleepy .  Work on weight loss.

## 2015-05-12 NOTE — Progress Notes (Signed)
Reviewed & agree with plan  

## 2015-05-25 ENCOUNTER — Encounter: Payer: Self-pay | Admitting: Adult Health

## 2015-06-06 ENCOUNTER — Telehealth: Payer: Self-pay | Admitting: Pulmonary Disease

## 2015-06-06 NOTE — Telephone Encounter (Signed)
Patient  returned call, CB is [phone].  Asked to leave a message if cannot reach her.

## 2015-06-06 NOTE — Telephone Encounter (Signed)
Left detailed message on pt's voicemail per her request. Nothing further was needed at this time.

## 2015-06-06 NOTE — Telephone Encounter (Signed)
Per Dr. Elsworth Soho: Compliance report shows CPAP is very effective at 10cm, needs to be more consistent, use more than 6 hours night.  -------------------------------------------------  Left message for patient to call back.

## 2015-08-02 ENCOUNTER — Ambulatory Visit: Payer: Medicare Other | Admitting: Adult Health

## 2015-08-18 ENCOUNTER — Encounter: Payer: Self-pay | Admitting: Adult Health

## 2015-08-18 ENCOUNTER — Ambulatory Visit (INDEPENDENT_AMBULATORY_CARE_PROVIDER_SITE_OTHER): Payer: Medicare Other | Admitting: Adult Health

## 2015-08-18 ENCOUNTER — Encounter (INDEPENDENT_AMBULATORY_CARE_PROVIDER_SITE_OTHER): Payer: Self-pay

## 2015-08-18 VITALS — BP 122/84 | HR 71 | Temp 97.2°F | Ht 62.0 in | Wt 205.0 lb

## 2015-08-18 DIAGNOSIS — G4733 Obstructive sleep apnea (adult) (pediatric): Secondary | ICD-10-CM | POA: Diagnosis not present

## 2015-08-18 NOTE — Patient Instructions (Signed)
Can go by [facility] to check on mask .  Keep up good work Saline nasal spray and gel As needed   Try to wear for 4-6 hours each night.  Follow up in 6 months with Dr. Elsworth Soho  And As needed   Do not drive if sleepy .  Work on weight loss.

## 2015-08-18 NOTE — Progress Notes (Signed)
   Subjective:    Patient ID: Cheyenne Lucas, female    DOB: 15-Nov-1945, 70 y.o.   MRN: [id number]  HPI 70 yo smoker seen for sleep consult 02/2015 found to have moderate OSA   TEST  02/2015 HST AHI 26/hr  CPAP titration with good control at 10cm H2O .    08/18/2015 Follow up : OSA  Pt returns for 3 month  follow up .  Uses CPAP on average 4-6 hours nightly. Would like to discuss mask options.  Wants to go by [name] to check on mask.  Download shows good compliance with AHI at 0.4 Avg use at 4.5hr .  She denies chest pain, orthopnea, edema or fever.  Denies sign daytime sleepiness.     Past Medical History:  Diagnosis Date  . Vitamin D deficiency     Current Outpatient Prescriptions on File Prior to Visit  Medication Sig Dispense Refill  . ergocalciferol (VITAMIN D2) 50000 units capsule Take 50,000 Units by mouth once a week.    . meloxicam (MOBIC) 7.5 MG tablet Take 1 tablet (7.5 mg total) by mouth daily as needed for pain. 10 tablet 0   No current facility-administered medications on file prior to visit.       Review of Systems Constitutional:   No  weight loss, night sweats,  Fevers, chills, fatigue, or  lassitude.  HEENT:   No headaches,  Difficulty swallowing,  Tooth/dental problems, or  Sore throat,                No sneezing, itching, ear ache, nasal congestion, post nasal drip,   CV:  No chest pain,  Orthopnea, PND, swelling in lower extremities, anasarca, dizziness, palpitations, syncope.   GI  No heartburn, indigestion, abdominal pain, nausea, vomiting, diarrhea, change in bowel habits, loss of appetite, bloody stools.   Resp:  No chest wall deformity  Skin: no rash or lesions.  GU: no dysuria, change in color of urine, no urgency or frequency.  No flank pain, no hematuria   MS:  No joint pain or swelling.  No decreased range of motion.  No back pain.  Psych:  No change in mood or affect. No depression or anxiety.  No memory loss.         Objective:   Physical Exam Vitals:   08/18/15 1443  BP: 122/84  Pulse: 71  Temp: 97.2 F (36.2 C)  TempSrc: Oral  SpO2: 100%  Weight: 205 lb (93 kg)  Height: 5\' 2"  (1.575 m)  Body mass index is 37.49 kg/m.   GEN: A/Ox3; pleasant , NAD, obese     HEENT:  [place]/AT,  EACs-clear, TMs-wnl, NOSE-clear, THROAT-clear, no lesions, no postnasal drip or exudate noted. Class 2-3 MP airway   NECK:  Supple w/ fair ROM; no JVD; normal carotid impulses w/o bruits; no thyromegaly or nodules palpated; no lymphadenopathy.    RESP  Clear  P & A; w/o, wheezes/ rales/ or rhonchi. no accessory muscle use, no dullness to percussion  CARD:  RRR, no m/r/g  , no peripheral edema, pulses intact, no cyanosis or clubbing.  GI:   Soft & nt; nml bowel sounds; no organomegaly or masses detected.   Musco: Warm bil, no deformities or joint swelling noted.   Neuro: alert, no focal deficits noted.    Skin: Warm, no lesions or rashes  Shaun Runyon NP-C  [facility]  08/18/2015       Assessment & Plan:

## 2015-08-19 NOTE — Assessment & Plan Note (Signed)
Can go by [facility] to check on mask .  Keep up good work Saline nasal spray and gel As needed   Try to wear for 4-6 hours each night.  Follow up in 6 months with Dr. Elsworth Soho  And As needed   Do not drive if sleepy .  Work on weight loss.

## 2015-09-09 ENCOUNTER — Ambulatory Visit: Payer: Medicare Other | Admitting: Pulmonary Disease

## 2015-11-22 ENCOUNTER — Telehealth: Payer: Self-pay | Admitting: Acute Care

## 2015-11-22 DIAGNOSIS — F17210 Nicotine dependence, cigarettes, uncomplicated: Secondary | ICD-10-CM

## 2015-11-22 NOTE — Telephone Encounter (Signed)
This is documentation of a phone call made to Ms. Cheyenne Lucas on 04/08/2015 with the results of her low-dose screening CT. I explained to her that his scan was read as a lung RADS category 2, nodules that are benign in appearance or behavior. I explained that recommendation per radiology was for continued annual screening with low-dose CT in March 2018. We will order and schedule her annual scan for March 2018. She verbalized understanding of the results of the scan and had no further questions at completion of the phone call.

## 2015-11-23 ENCOUNTER — Emergency Department (HOSPITAL_COMMUNITY)
Admission: EM | Admit: 2015-11-23 | Discharge: 2015-11-23 | Disposition: A | Discharge status: Home / Self Care | Payer: Medicare Other | Attending: Emergency Medicine | Admitting: Emergency Medicine

## 2015-11-23 ENCOUNTER — Emergency Department (HOSPITAL_COMMUNITY): Payer: Medicare Other

## 2015-11-23 ENCOUNTER — Encounter (HOSPITAL_COMMUNITY): Payer: Self-pay

## 2015-11-23 DIAGNOSIS — J40 Bronchitis, not specified as acute or chronic: Secondary | ICD-10-CM | POA: Diagnosis not present

## 2015-11-23 DIAGNOSIS — R05 Cough: Secondary | ICD-10-CM | POA: Diagnosis present

## 2015-11-23 DIAGNOSIS — F17210 Nicotine dependence, cigarettes, uncomplicated: Secondary | ICD-10-CM | POA: Diagnosis not present

## 2015-11-23 DIAGNOSIS — J00 Acute nasopharyngitis [common cold]: Secondary | ICD-10-CM | POA: Insufficient documentation

## 2015-11-23 DIAGNOSIS — R059 Cough, unspecified: Secondary | ICD-10-CM

## 2015-11-23 MED ORDER — BENZONATATE 100 MG PO CAPS: 100.0000 mg | ORAL_CAPSULE | Freq: Three times a day (TID) | ORAL | 0 refills | Status: AC | PRN

## 2015-11-23 MED ORDER — DEXAMETHASONE 4 MG PO TABS
10.0000 mg | ORAL_TABLET | Freq: Once | ORAL | Status: AC
  Administered 2015-11-23: 10 mg via ORAL
  Filled 2015-11-23: qty 2

## 2015-11-23 MED ORDER — ALBUTEROL SULFATE HFA 108 (90 BASE) MCG/ACT IN AERS
2.0000 | INHALATION_SPRAY | Freq: Once | RESPIRATORY_TRACT | Status: AC
  Administered 2015-11-23: 2 via RESPIRATORY_TRACT
  Filled 2015-11-23: qty 6.7

## 2015-11-23 NOTE — ED Notes (Signed)
Pt alert and oriented upon discharge, ambulated without DOE to checkout. Discharge instructions and  Paperwork reviewed.

## 2015-11-23 NOTE — ED Notes (Signed)
Patient transported to X-ray 

## 2015-11-23 NOTE — ED Provider Notes (Signed)
[facility] DEPT Provider Note   CSN: [id number] Arrival date & time: 11/23/15  1325     History   Chief Complaint Chief Complaint  Patient presents with  . Cough  . Nasal Congestion  . Chest Congestion    HPI Cheyenne Lucas is a 70 y.o. female.  HPI   Cough for 3-4 days, cough severe Nasal congestion started yesterday Works as Music therapist at [employer], thinks picked it up from students No fevers Cough, dry No dyspnea, no nausea Hx smoking, decreasing but still smoking some, no hx of wheezing in past.  Past Medical History:  Diagnosis Date  . Vitamin D deficiency     Patient Active Problem List   Diagnosis Date Noted  . OSA (obstructive sleep apnea) 02/07/2015  . Tobacco abuse counseling 02/07/2015    Past Surgical History:  Procedure Laterality Date  . CESAREAN SECTION    . WISDOM TOOTH EXTRACTION     no anesthesia    OB History    No data available       Home Medications    Prior to Admission medications   Medication Sig Start Date End Date Taking? Authorizing Provider  ergocalciferol (VITAMIN D2) 50000 units capsule Take 50,000 Units by mouth once a week.   Yes Historical Provider, MD  guaifenesin (ROBITUSSIN) 100 MG/5ML syrup Take 200 mg by mouth 3 (three) times daily as needed for cough.   Yes Historical Provider, MD  meloxicam (MOBIC) 7.5 MG tablet Take 1 tablet (7.5 mg total) by mouth daily as needed for pain. 11/08/14  Yes Clayton Bibles, PA-C  sodium-potassium bicarbonate (ALKA-SELTZER GOLD) TBEF dissolvable tablet Take 1 tablet by mouth daily as needed (for cold symptoms).   Yes Historical Provider, MD  benzonatate (TESSALON) 100 MG capsule Take 1 capsule (100 mg total) by mouth 3 (three) times daily as needed for cough. 11/23/15   Gareth Morgan, MD    Family History Family History  Problem Relation Age of Onset  . Colon cancer Paternal Grandmother     Social History Social History  Substance Use Topics  . Smoking status: Current Some  Day Smoker    Packs/day: 1.00    Years: 51.00    Types: Cigarettes  . Smokeless tobacco: Never Used     Comment: Counseled that quitting smoking is single most powerful action that she can take to decrease her risk of lung cancer.  . Alcohol use No     Allergies   Patient has no known allergies.   Review of Systems Review of Systems  Constitutional: Negative for appetite change, fatigue and fever.  HENT: Positive for congestion and rhinorrhea. Negative for sore throat.   Eyes: Negative for visual disturbance.  Respiratory: Positive for cough. Negative for shortness of breath.   Cardiovascular: Negative for chest pain.  Gastrointestinal: Negative for abdominal pain, nausea and vomiting.  Genitourinary: Negative for difficulty urinating.  Musculoskeletal: Negative for back pain and neck pain.  Skin: Negative for rash.  Neurological: Negative for syncope and headaches.     Physical Exam Updated Vital Signs BP 130/90 (BP Location: Right Arm)   Pulse 88   Temp 99.2 F (37.3 C) (Oral)   Resp 20   Wt 199 lb 6.4 oz (90.4 kg)   SpO2 98%   BMI 36.47 kg/m   Physical Exam  Constitutional: She is oriented to person, place, and time. She appears well-developed and well-nourished. No distress.  HENT:  Head: Normocephalic and atraumatic.  Right TM WNL, left TM cerumen  obstruction  Eyes: Conjunctivae and EOM are normal.  Neck: Normal range of motion.  Cardiovascular: Normal rate, regular rhythm, normal heart sounds and intact distal pulses.  Exam reveals no gallop and no friction rub.   No murmur heard. Pulmonary/Chest: Effort normal. No respiratory distress. She has wheezes (occasional end expiratory wheeze varying in all lung fields). She has no rales.  Abdominal: Soft. She exhibits no distension. There is no tenderness. There is no guarding.  Musculoskeletal: She exhibits no edema or tenderness.  Neurological: She is alert and oriented to person, place, and time.  Skin: Skin  is warm and dry. No rash noted. She is not diaphoretic. No erythema.  Nursing note and vitals reviewed.    ED Treatments / Results  Labs (all labs ordered are listed, but only abnormal results are displayed) Labs Reviewed - No data to display  EKG  EKG Interpretation None       Radiology Dg Chest 2 View  Result Date: 11/23/2015 CLINICAL DATA:  Cough and congestion for 3 days.  Fever. EXAM: CHEST  2 VIEW COMPARISON:  None. FINDINGS: There is atelectatic change in both mid lung regions and in the right base. There is no edema or consolidation. Heart size and pulmonary vascularity are normal. No adenopathy. There is degenerative change in the thoracic spine. IMPRESSION: Areas of mild atelectasis bilaterally. No frank edema or consolidation. Electronically Signed   By: Lowella Grip III M.D.   On: 11/23/2015 14:42    Procedures Procedures (including critical care time)  Medications Ordered in ED Medications  albuterol (PROVENTIL HFA;VENTOLIN HFA) 108 (90 Base) MCG/ACT inhaler 2 puff (2 puffs Inhalation Given 11/23/15 1549)  dexamethasone (DECADRON) tablet 10 mg (10 mg Oral Given 11/23/15 1550)     Initial Impression / Assessment and Plan / ED Course  I have reviewed the triage vital signs and the nursing notes.  Pertinent labs & imaging results that were available during my care of the patient were reviewed by me and considered in my medical decision making (see chart for details).  Clinical Course    70yo female presents with concern for cough, congestion. XR shows atelectasis without clear sign of pneumonia. Patient well appearing, normal vital signs. No sign of bacterial infection by hx or exam.  Suspect likely viral infection. Occasional wheezing with hx of smoking however no prior hx to suggest COPD.  Will treat with decadron and albuterol, suspect some bronchospasm from viral bronchitis.  Gave rx for tessalon. Recommend PCP follow up, return to ED for worsening symptoms.  Patient discharged in stable condition with understanding of reasons to return.   Final Clinical Impressions(s) / ED Diagnoses   Final diagnoses:  Acute nasopharyngitis  Cough  Bronchitis    New Prescriptions Discharge Medication List as of 11/23/2015  3:29 PM       Gareth Morgan, MD 11/23/15 2301

## 2015-11-23 NOTE — ED Triage Notes (Signed)
Pt c/o cough, nasal congestion and chest congestion x 3 days.  Denies pain.  Pt reports taking OTC medications w/o relief.

## 2016-01-14 ENCOUNTER — Encounter (HOSPITAL_COMMUNITY): Payer: Self-pay | Admitting: Emergency Medicine

## 2016-01-14 ENCOUNTER — Emergency Department (HOSPITAL_COMMUNITY)
Admission: EM | Admit: 2016-01-14 | Discharge: 2016-01-14 | Disposition: A | Discharge status: Home / Self Care | Payer: Medicare Other | Attending: Emergency Medicine | Admitting: Emergency Medicine

## 2016-01-14 DIAGNOSIS — F17210 Nicotine dependence, cigarettes, uncomplicated: Secondary | ICD-10-CM | POA: Diagnosis not present

## 2016-01-14 DIAGNOSIS — H6121 Impacted cerumen, right ear: Secondary | ICD-10-CM | POA: Diagnosis not present

## 2016-01-14 DIAGNOSIS — H938X1 Other specified disorders of right ear: Secondary | ICD-10-CM | POA: Diagnosis present

## 2016-01-14 HISTORY — DX: Unspecified osteoarthritis, unspecified site: M19.90

## 2016-01-14 MED ORDER — DOCUSATE SODIUM 50 MG/5ML PO LIQD
50.0000 mg | Freq: Once | ORAL | Status: AC
  Administered 2016-01-14: 50 mg via ORAL
  Filled 2016-01-14: qty 10

## 2016-01-14 NOTE — ED Provider Notes (Signed)
[facility] DEPT Provider Note   CSN: [id number] Arrival date & time: 01/14/16  1238  By signing my name below, I, Higinio Plan, attest that this documentation has been prepared under the direction and in the presence of Quincy Carnes, PA-C.  Electronically Signed: Higinio Plan, ED Scribe. 01/14/16. 1:51 PM.  History   Chief Complaint Chief Complaint  Patient presents with  . Ear Fullness   The history is provided by the patient. No language interpreter was used.   HPI Comments: Cheyenne Lucas is a 70 y.o. female who presents to the Emergency Department complaining of gradually worsening, right ear "fullness" that began ~1 week ago. Pt reports an associated "raoring" sound in her right ear. She notes hx of similar symptoms and states she usually receives cerumen disimpaction in her right ear once every year. She reports she attempted to "clean her ear out herself with peroxide" with no relief. She denies pain in her right ear.    Past Medical History:  Diagnosis Date  . Arthritis   . Vitamin D deficiency    Patient Active Problem List   Diagnosis Date Noted  . OSA (obstructive sleep apnea) 02/07/2015  . Tobacco abuse counseling 02/07/2015   Past Surgical History:  Procedure Laterality Date  . CESAREAN SECTION    . WISDOM TOOTH EXTRACTION     no anesthesia   OB History    No data available     Home Medications    Prior to Admission medications   Medication Sig Start Date End Date Taking? Authorizing Provider  benzonatate (TESSALON) 100 MG capsule Take 1 capsule (100 mg total) by mouth 3 (three) times daily as needed for cough. 11/23/15   Gareth Morgan, MD  ergocalciferol (VITAMIN D2) 50000 units capsule Take 50,000 Units by mouth once a week.    Historical Provider, MD  guaifenesin (ROBITUSSIN) 100 MG/5ML syrup Take 200 mg by mouth 3 (three) times daily as needed for cough.    Historical Provider, MD  meloxicam (MOBIC) 7.5 MG tablet Take 1 tablet (7.5 mg total) by mouth  daily as needed for pain. 11/08/14   Clayton Bibles, PA-C  sodium-potassium bicarbonate (ALKA-SELTZER GOLD) TBEF dissolvable tablet Take 1 tablet by mouth daily as needed (for cold symptoms).    Historical Provider, MD    Family History Family History  Problem Relation Age of Onset  . Colon cancer Paternal Grandmother     Social History Social History  Substance Use Topics  . Smoking status: Current Some Day Smoker    Packs/day: 1.00    Years: 51.00    Types: Cigarettes  . Smokeless tobacco: Never Used     Comment: Counseled that quitting smoking is single most powerful action that she can take to decrease her risk of lung cancer.  . Alcohol use No     Allergies   Patient has no known allergies.   Review of Systems Review of Systems  HENT: Negative for ear pain.        +right ear "fullness"   All other systems reviewed and are negative.  Physical Exam Updated Vital Signs BP 124/85 (BP Location: Left Arm)   Pulse 87   Temp 98.1 F (36.7 C) (Oral)   Resp 18   SpO2 99%   Physical Exam  Constitutional: She is oriented to person, place, and time. She appears well-developed and well-nourished.  HENT:  Head: Normocephalic and atraumatic.  Mouth/Throat: Oropharynx is clear and moist.  Right-sided cerumen impaction, TM not clearly visible secondary  to this Left ear normal  Eyes: Conjunctivae and EOM are normal. Pupils are equal, round, and reactive to light.  Neck: Normal range of motion.  Cardiovascular: Normal rate, regular rhythm and normal heart sounds.   Pulmonary/Chest: Effort normal and breath sounds normal.  Abdominal: Soft. Bowel sounds are normal.  Musculoskeletal: Normal range of motion.  Neurological: She is alert and oriented to person, place, and time.  Skin: Skin is warm and dry.  Psychiatric: She has a normal mood and affect.  Nursing note and vitals reviewed.  ED Treatments / Results  Labs (all labs ordered are listed, but only abnormal results are  displayed) Labs Reviewed - No data to display  EKG  EKG Interpretation None       Radiology No results found.  Procedures .Ear Cerumen Removal Date/Time: 01/14/2016 4:04 PM Performed by: Larene Pickett Authorized by: Larene Pickett   Consent:    Consent obtained:  Verbal   Consent given by:  Patient   Risks discussed:  Bleeding, infection and pain   Alternatives discussed:  No treatment Procedure details:    Location:  R ear   Procedure type: curette   Post-procedure details:    Hearing quality:  Improved   Patient tolerance of procedure:  Tolerated well, no immediate complications   (including critical care time)  Medications Ordered in ED Medications - No data to display  DIAGNOSTIC STUDIES:  Oxygen Saturation is 99% on RA, normal by my interpretation.    COORDINATION OF CARE:  1:48 PM Discussed treatment plan with pt at bedside and pt agreed to plan.  Initial Impression / Assessment and Plan / ED Course  I have reviewed the triage vital signs and the nursing notes.  Pertinent labs & imaging results that were available during my care of the patient were reviewed by me and considered in my medical decision making (see chart for details).  Clinical Course    70 y.o. F here with cerumen impaction of right ear.  Hx of same.  Measures here including colace in the ear followed by irrigation and curette with partial disimpaction.  Patient reports symptoms have improved, states hearing is better now.  Will have her continue irrigating at home, follow-up with PCP.  Discussed plan with patient, she acknowledged understanding and agreed with plan of care.  Return precautions given for new or worsening symptoms.  I personally performed the services described in this documentation, which was scribed in my presence. The recorded information has been reviewed and is accurate.  Final Clinical Impressions(s) / ED Diagnoses   Final diagnoses:  Impacted cerumen of right ear     New Prescriptions Discharge Medication List as of 01/14/2016  3:05 PM       Larene Pickett, PA-C 01/14/16 [place], MD 01/17/16 [phone]

## 2016-01-14 NOTE — Discharge Instructions (Signed)
May continue flushing ear at home. Follow-up with your primary care doctor if you need your ear flushed out again. Return here for new concerns.

## 2016-01-14 NOTE — ED Triage Notes (Signed)
Patient states that she had cold and still getting over it but for about a week she has had right ear fullness and "roaring sound, I have tried to use peroxide to clean it out myself but not working, usually about once a year I have to get it cleaned out".  Patient states sometimes has little ache with right ear.

## 2016-01-17 ENCOUNTER — Encounter (HOSPITAL_COMMUNITY): Payer: Self-pay | Admitting: Emergency Medicine

## 2016-01-17 ENCOUNTER — Ambulatory Visit (HOSPITAL_COMMUNITY)
Admission: EM | Admit: 2016-01-17 | Discharge: 2016-01-17 | Disposition: A | Discharge status: Home / Self Care | Payer: Medicare Other | Attending: Family Medicine | Admitting: Family Medicine

## 2016-01-17 DIAGNOSIS — H6121 Impacted cerumen, right ear: Secondary | ICD-10-CM | POA: Diagnosis not present

## 2016-01-17 NOTE — ED Provider Notes (Signed)
CSN: [id number]     Arrival date & time 01/17/16  1026 History   First MD Initiated Contact with Patient 01/17/16 1128     Chief Complaint  Patient presents with  . Otalgia   (Consider location/radiation/quality/duration/timing/severity/associated sxs/prior Treatment) 71 year old female female presents to clinic with chief complaint of right ear fullness and decrease in hearing. Patient states she gets cerumen impactions often and has to have her ear cleaned once or twice a year every year. Patient denies any other symptoms or complaints.   The history is provided by the patient.  Otalgia  Associated symptoms: hearing loss   Associated symptoms: no tinnitus     Past Medical History:  Diagnosis Date  . Arthritis   . Vitamin D deficiency    Past Surgical History:  Procedure Laterality Date  . CESAREAN SECTION    . WISDOM TOOTH EXTRACTION     no anesthesia   Family History  Problem Relation Age of Onset  . Colon cancer Paternal Grandmother    Social History  Substance Use Topics  . Smoking status: Current Some Day Smoker    Packs/day: 1.00    Years: 51.00    Types: Cigarettes  . Smokeless tobacco: Never Used     Comment: Counseled that quitting smoking is single most powerful action that she can take to decrease her risk of lung cancer.  . Alcohol use No   OB History    No data available     Review of Systems  Constitutional: Negative.   HENT: Positive for ear pain and hearing loss. Negative for tinnitus.   Eyes: Negative.   Respiratory: Negative.   Cardiovascular: Negative.   Gastrointestinal: Negative.   Musculoskeletal: Negative.     Allergies  Patient has no known allergies.  Home Medications   Prior to Admission medications   Medication Sig Start Date End Date Taking? Authorizing Provider  benzonatate (TESSALON) 100 MG capsule Take 1 capsule (100 mg total) by mouth 3 (three) times daily as needed for cough. 11/23/15   Gareth Morgan, MD  ergocalciferol  (VITAMIN D2) 50000 units capsule Take 50,000 Units by mouth once a week.    Historical Provider, MD  guaifenesin (ROBITUSSIN) 100 MG/5ML syrup Take 200 mg by mouth 3 (three) times daily as needed for cough.    Historical Provider, MD  meloxicam (MOBIC) 7.5 MG tablet Take 1 tablet (7.5 mg total) by mouth daily as needed for pain. 11/08/14   Clayton Bibles, PA-C  sodium-potassium bicarbonate (ALKA-SELTZER GOLD) TBEF dissolvable tablet Take 1 tablet by mouth daily as needed (for cold symptoms).    Historical Provider, MD   Meds Ordered and Administered this Visit  Medications - No data to display  BP 132/91 (BP Location: Right Arm)   Pulse 77   Temp 98.5 F (36.9 C) (Oral)   Resp 18   SpO2 98%  No data found.   Physical Exam  Constitutional: She is oriented to person, place, and time. She appears well-developed and well-nourished. No distress.  HENT:  Head: Normocephalic.  Right Ear: External ear normal.  Left Ear: Hearing, tympanic membrane, external ear and ear canal normal.  Ears:  Mouth/Throat: No oropharyngeal exudate.  Eyes: Pupils are equal, round, and reactive to light. Right eye exhibits no discharge. Left eye exhibits no discharge.  Neck: Normal range of motion. Neck supple. No JVD present.  Pulmonary/Chest: Effort normal and breath sounds normal.  Lymphadenopathy:    She has no cervical adenopathy.  Neurological: She  is alert and oriented to person, place, and time.  Skin: Skin is warm and dry. Capillary refill takes less than 2 seconds. She is not diaphoretic. No erythema. No pallor.  Psychiatric: She has a normal mood and affect.  Nursing note and vitals reviewed.   Urgent Care Course   Clinical Course     Procedures (including critical care time)  Labs Review Labs Reviewed - No data to display  Imaging Review No results found.   Visual Acuity Review  Right Eye Distance:   Left Eye Distance:   Bilateral Distance:    Right Eye Near:   Left Eye Near:      Bilateral Near:         MDM   1. Impacted cerumen of right ear    Orders placed for cerumen removal. Patients canal reassessed after procedure. TM intact, no scaring or perforation of the membrane. Should symptoms reoccur follow up with PCP or return to clinic as needed.     Barnet Glasgow, NP 01/17/16 1242

## 2016-01-17 NOTE — Discharge Instructions (Signed)
Should your symptoms reoccur follow up with your primary care provider or return to clinic as needed for cerumen disimpaction.

## 2016-01-17 NOTE — ED Triage Notes (Addendum)
Reports cough, cold a few weeks ago.  Right ear is hurting and cough is continuing.  "roaring" in right ear.  Decreased hearing

## 2016-04-11 ENCOUNTER — Ambulatory Visit (INDEPENDENT_AMBULATORY_CARE_PROVIDER_SITE_OTHER)
Admission: RE | Admit: 2016-04-11 | Discharge: 2016-04-11 | Disposition: A | Discharge status: Home / Self Care | Payer: Medicare Other | Source: Ambulatory Visit | Attending: Acute Care | Admitting: Acute Care

## 2016-04-11 DIAGNOSIS — Z87891 Personal history of nicotine dependence: Secondary | ICD-10-CM

## 2016-04-11 DIAGNOSIS — F17210 Nicotine dependence, cigarettes, uncomplicated: Secondary | ICD-10-CM

## 2016-04-18 ENCOUNTER — Telehealth: Payer: Self-pay | Admitting: Acute Care

## 2016-04-18 DIAGNOSIS — F17210 Nicotine dependence, cigarettes, uncomplicated: Principal | ICD-10-CM

## 2016-04-18 NOTE — Telephone Encounter (Signed)
Spoke with pt and informed of ct results per Eric Form, NP.  Pt verbalized understanding.  Copy sent to PCP.  Order placed for 1 yr f/u ct.

## 2016-04-18 NOTE — Telephone Encounter (Signed)
Patient returning call - she can be reached at [phone] -pr

## 2016-04-18 NOTE — Telephone Encounter (Signed)
LMTC x 1  

## 2016-07-16 ENCOUNTER — Encounter (HOSPITAL_COMMUNITY): Payer: Self-pay | Admitting: Emergency Medicine

## 2016-07-16 ENCOUNTER — Ambulatory Visit (HOSPITAL_COMMUNITY)
Admission: EM | Admit: 2016-07-16 | Discharge: 2016-07-16 | Disposition: A | Discharge status: Home / Self Care | Payer: Medicare Other | Attending: Family Medicine | Admitting: Family Medicine

## 2016-07-16 DIAGNOSIS — M25561 Pain in right knee: Secondary | ICD-10-CM

## 2016-07-16 NOTE — Discharge Instructions (Signed)
Today you were diagnosed with the following: 1. Right knee pain, unspecified chronicity    You have not been prescribed prescription medications this visit. Since you have Meloxicam at home you may use this twice a day for 3-5 days in addition to Tylenol if needed. If pain continues, please schedule a follow up appointment with your primary care doctor.  If you are not improving over the next few days or feel you are worsening please follow up here or the Emergency Department if you are unable to see your regular doctor.

## 2016-07-16 NOTE — ED Triage Notes (Signed)
Patient reports a 10 year history of right knee pain and swelling that has been intermittent.  No new injury.  Has felt popping and cracking.    Patient is requesting a cortisone injection

## 2016-07-16 NOTE — ED Provider Notes (Signed)
  [facility]   [id number] 07/16/16 Arrival Time: 0737  ASSESSMENT & PLAN:  Today you were diagnosed with the following: 1. Right knee pain, unspecified chronicity    You have not been prescribed prescription medications this visit. Since you have Meloxicam at home you may use this twice a day for 3-5 days in addition to Tylenol if needed. If pain continues, please schedule a follow up appointment with your primary care doctor.  If you are not improving over the next few days or feel you are worsening please follow up here or the Emergency Department if you are unable to see your regular doctor.  Discussed possibility of meniscal etiology.  Reviewed expectations re: course of current medical issues. Questions answered. Outlined signs and symptoms indicating need for more acute intervention. Patient verbalized understanding. After Visit Summary given.   SUBJECTIVE:  Cheyenne Lucas is a 71 y.o. female who presents with complaint of acute on chronic R knee soreness/discomfort. Has on and off problems with her R knee for the past 10 years. For the past 2 weeks she has experienced medial pain without injury. Last evening knee felt like it locked up on her. "Felt if pop when I moved it." Now with normal ROM and lateral knee pain. Ambulatory. No swelling at knee. No LE sensation changes. No further locking of knee. Last week took Meloxicam with mild relief.  ROS: As per HPI.   OBJECTIVE:  Vitals:   07/16/16 1328  BP: 117/70  Pulse: 86  Resp: 16  Temp: 97.8 F (36.6 C)  TempSrc: Oral  SpO2: 100%     General appearance: alert, cooperative, appears stated age and no distress Extremities: extremities normal, atraumatic, no cyanosis or edema MSK: R knee without swelling; reports poorly localized tenderness over LCL; FROM; normal strength; no skin changes or erythema; McMurray negative Skin: warm and dry; no rashes or lesions Neurologic: Alert and oriented X 3; normal  symmetric reflexes; normal gait but favors RLE  No Known Allergies  PMHx, SurgHx, SocialHx, Medications, and Allergies were reviewed in the Visit Navigator and updated as appropriate.       Vanessa Kick, MD 07/16/16 1407

## 2016-12-12 ENCOUNTER — Other Ambulatory Visit: Payer: Self-pay | Admitting: Internal Medicine

## 2016-12-12 DIAGNOSIS — E2839 Other primary ovarian failure: Secondary | ICD-10-CM

## 2017-01-14 ENCOUNTER — Ambulatory Visit (HOSPITAL_COMMUNITY)
Admission: EM | Admit: 2017-01-14 | Discharge: 2017-01-14 | Disposition: A | Discharge status: Home / Self Care | Payer: Medicare Other | Attending: Family Medicine | Admitting: Family Medicine

## 2017-01-14 ENCOUNTER — Encounter (HOSPITAL_COMMUNITY): Payer: Self-pay | Admitting: Family Medicine

## 2017-01-14 DIAGNOSIS — J069 Acute upper respiratory infection, unspecified: Secondary | ICD-10-CM | POA: Diagnosis not present

## 2017-01-14 DIAGNOSIS — B9789 Other viral agents as the cause of diseases classified elsewhere: Secondary | ICD-10-CM | POA: Diagnosis not present

## 2017-01-14 MED ORDER — HYDROCODONE-HOMATROPINE 5-1.5 MG/5ML PO SYRP: 5.0000 mL | ORAL_SOLUTION | Freq: Four times a day (QID) | ORAL | 0 refills | Status: AC | PRN

## 2017-01-14 NOTE — ED Provider Notes (Signed)
Cheyenne Lucas    CSN: [id number] Arrival date & time: 01/14/17  1035     History   Chief Complaint Chief Complaint  Patient presents with  . Nasal Congestion  . Cough    HPI Cheyenne Lucas is a 71 y.o. female presenting with [place] symptoms including cough and congestion/rhinnnorhea, watery eyes for 3 days. She has been taking robitussin, nyquil, benadryl and mucinex without minimal relief. Mainly complaining of cough at night. Current smoker- 1/2 pack per day. No sore throat. No fever. No chest pain, mild chest tightness.   HPI  Past Medical History:  Diagnosis Date  . Arthritis   . Vitamin D deficiency     Patient Active Problem List   Diagnosis Date Noted  . OSA (obstructive sleep apnea) 02/07/2015  . Tobacco abuse counseling 02/07/2015    Past Surgical History:  Procedure Laterality Date  . CESAREAN SECTION    . WISDOM TOOTH EXTRACTION     no anesthesia    OB History    No data available       Home Medications    Prior to Admission medications   Medication Sig Start Date End Date Taking? Authorizing Provider  benzonatate (TESSALON) 100 MG capsule Take 1 capsule (100 mg total) by mouth 3 (three) times daily as needed for cough. 11/23/15   Gareth Morgan, MD  ergocalciferol (VITAMIN D2) 50000 units capsule Take 50,000 Units by mouth once a week.    [provider]  guaifenesin (ROBITUSSIN) 100 MG/5ML syrup Take 200 mg by mouth 3 (three) times daily as needed for cough.    [provider]  HYDROcodone-homatropine (HYCODAN) 5-1.5 MG/5ML syrup Take 5 mLs by mouth every 6 (six) hours as needed for up to 5 days for cough. Use only in the evening/bedtime 01/14/17 01/19/17  Wieters, [employer] C, PA-C  meloxicam (MOBIC) 7.5 MG tablet Take 1 tablet (7.5 mg total) by mouth daily as needed for pain. 11/08/14   Clayton Bibles, PA-C  sodium-potassium bicarbonate (ALKA-SELTZER GOLD) TBEF dissolvable tablet Take 1 tablet by mouth daily as needed (for cold  symptoms).    [provider]    Family History Family History  Problem Relation Age of Onset  . Colon cancer Paternal Grandmother     Social History Social History   Tobacco Use  . Smoking status: Current Some Day Smoker    Packs/day: 1.00    Years: 51.00    Pack years: 51.00    Types: Cigarettes  . Smokeless tobacco: Never Used  . Tobacco comment: Counseled that quitting smoking is single most powerful action that she can take to decrease her risk of lung cancer.  Substance Use Topics  . Alcohol use: No    Alcohol/week: 0.0 oz  . Drug use: No     Allergies   Patient has no known allergies.   Review of Systems Review of Systems  Constitutional: Negative for chills, fatigue and fever.  HENT: Positive for congestion, rhinorrhea and sore throat. Negative for ear pain, sinus pressure and trouble swallowing.   Respiratory: Positive for cough and chest tightness. Negative for shortness of breath.   Cardiovascular: Negative for chest pain.  Gastrointestinal: Negative for abdominal pain, nausea and vomiting.  Musculoskeletal: Negative for myalgias.  Skin: Negative for rash.  Neurological: Negative for dizziness, light-headedness and headaches.     Physical Exam Triage Vital Signs ED Triage Vitals [01/14/17 1136]  Enc Vitals Group     BP 134/75     Pulse Rate  96     Resp 18     Temp 98.8 F (37.1 C)     Temp src      SpO2 100 %     Weight      Height      Head Circumference      Peak Flow      Pain Score      Pain Loc      Pain Edu?      Excl. in [place]?    No data found.  Updated Vital Signs BP 134/75   Pulse 96   Temp 98.8 F (37.1 C)   Resp 18   SpO2 100%   Visual Acuity Right Eye Distance:   Left Eye Distance:   Bilateral Distance:    Right Eye Near:   Left Eye Near:    Bilateral Near:     Physical Exam  Constitutional: She is oriented to person, place, and time. She appears well-developed and well-nourished. No distress.  HENT:    Head: Normocephalic and atraumatic.  Right Ear: Tympanic membrane and ear canal normal.  Left Ear: Tympanic membrane and ear canal normal.  Nose: Nose normal.  Mouth/Throat: Uvula is midline, oropharynx is clear and moist and mucous membranes are normal. No oral lesions. No trismus in the jaw. No uvula swelling. No posterior oropharyngeal erythema.  Eyes: Conjunctivae, EOM and lids are normal. Pupils are equal, round, and reactive to light. Right conjunctiva is not injected. Left conjunctiva is not injected.  Neck: Neck supple.  Cardiovascular: Normal rate and regular rhythm.  No murmur heard. Pulmonary/Chest: Effort normal and breath sounds normal. No respiratory distress. She has no wheezes.  Abdominal: Soft. There is no tenderness.  Musculoskeletal: She exhibits no edema.  Neurological: She is alert and oriented to person, place, and time.  Skin: Skin is warm and dry.  Psychiatric: She has a normal mood and affect.  Nursing note and vitals reviewed.   UC Treatments / Results  Labs (all labs ordered are listed, but only abnormal results are displayed) Labs Reviewed - No data to display  EKG  EKG Interpretation None       Radiology No results found.  Procedures Procedures (including critical care time)  Medications Ordered in UC Medications - No data to display   Initial Impression / Assessment and Plan / UC Course  I have reviewed the triage vital signs and the nursing notes.  Pertinent labs & imaging results that were available during my care of the patient were reviewed by me and considered in my medical decision making (see chart for details).     Patient presents with symptoms likely from a viral upper respiratory infection. Differential includes bacterial pneumonia, sinusitis, allergic rhinitis, acute bronchitis. Do not suspect underlying cardiopulmonary process. Symptoms seem unlikely related to ACS, CHF or COPD exacerbations, pneumonia, pneumothorax. Patient  is nontoxic appearing and not in need of emergent medical intervention.  Recommended symptom control with over the counter medications: Daily oral anti-histamine, Oral decongestant or IN corticosteroid, saline irrigations, cepacol lozenges, Robitussin, Delsym, honey tea. For night time cough, prescribed hycodan, advised to only use at night, no driving- no relief with robitussin, tessalon in past without much relief.  Return if symptoms fail to improve in 1-2 weeks or you develop shortness of breath, chest pain, severe headache. Patient states understanding and is agreeable.     Final Clinical Impressions(s) / UC Diagnoses   Final diagnoses:  Viral URI with cough    ED Discharge  Orders        Ordered    HYDROcodone-homatropine (HYCODAN) 5-1.5 MG/5ML syrup  Every 6 hours PRN     01/14/17 1213       Controlled Substance Prescriptions [place] Controlled Substance Registry consulted? Yes, I have consulted the [place] Controlled Substances Registry for this patient, and feel the risk/benefit ratio today is favorable for proceeding with this prescription for a controlled substance.   Joneen Caraway [place] C, PA-C 01/14/17 1226

## 2017-01-14 NOTE — ED Triage Notes (Signed)
Pt here for 3 days of URI symptoms. Taking OTC meds without relief.

## 2017-01-14 NOTE — Discharge Instructions (Signed)
You likely having a viral upper respiratory infection. We recommended symptom control. I expect your symptoms to start improving in the next 1-2 weeks.  ° °1. Take a daily allergy pill/anti-histamine like Zyrtec, Claritin, or Store brand consistently for 2 weeks ° °2. For congestion you may try an oral decongestant like Mucinex or sudafed. You may also try intranasal flonase nasal spray or saline irrigations (neti pot, sinus cleanse) ° °3. For your sore throat you may try cepacol lozenges, salt water gargles, throat spray. Treatment of congestion may also help your sore throat. ° °4. For cough you may try Robitussen, Mucinex DM ° °5. Take Tylenol or Ibuprofen to help with pain/inflammation ° °6. Stay hydrated, drink plenty of fluids to keep throat coated and less irritated ° °Honey Tea °For cough/sore throat try using a honey-based tea. Use 3 teaspoons of honey with juice squeezed from half lemon. Place shaved pieces of ginger into 1/2-1 cup of water and warm over stove top. Then mix the ingredients and repeat every 4 hours as needed. °

## 2017-04-12 ENCOUNTER — Inpatient Hospital Stay: Admission: RE | Admit: 2017-04-12 | Payer: Medicare Other | Source: Ambulatory Visit

## 2018-02-24 ENCOUNTER — Ambulatory Visit (HOSPITAL_COMMUNITY)
Admission: EM | Admit: 2018-02-24 | Discharge: 2018-02-24 | Disposition: A | Discharge status: Home / Self Care | Payer: Medicare Other | Attending: Family Medicine | Admitting: Family Medicine

## 2018-02-24 ENCOUNTER — Encounter (HOSPITAL_COMMUNITY): Payer: Self-pay

## 2018-02-24 ENCOUNTER — Other Ambulatory Visit: Payer: Self-pay

## 2018-02-24 DIAGNOSIS — H6123 Impacted cerumen, bilateral: Secondary | ICD-10-CM

## 2018-02-24 NOTE — ED Triage Notes (Signed)
Pt cc she need the waxed removed from her right ear. Pt states she gets this done once a year.

## 2018-02-24 NOTE — ED Provider Notes (Signed)
Pear[facility]    CSN: [id number] Arrival date & time: 02/24/18  1324     History   Chief Complaint Chief Complaint  Patient presents with  . Otalgia    HPI Cheyenne Lucas is a 73 y.o. female.   Patient's ears feel stopped up right greater than left.  She tells me she has to have her ears irrigated annually.  She uses Debrox but does not really do any manual irrigation. HPI  Past Medical History:  Diagnosis Date  . Arthritis   . Vitamin D deficiency     Patient Active Problem List   Diagnosis Date Noted  . OSA (obstructive sleep apnea) 02/07/2015  . Tobacco abuse counseling 02/07/2015    Past Surgical History:  Procedure Laterality Date  . CESAREAN SECTION    . WISDOM TOOTH EXTRACTION     no anesthesia    OB History   No obstetric history on file.      Home Medications    Prior to Admission medications   Medication Sig Start Date End Date Taking? Authorizing Provider  benzonatate (TESSALON) 100 MG capsule Take 1 capsule (100 mg total) by mouth 3 (three) times daily as needed for cough. 11/23/15   Gareth Morgan, MD  ergocalciferol (VITAMIN D2) 50000 units capsule Take 50,000 Units by mouth once a week.    [provider]  guaifenesin (ROBITUSSIN) 100 MG/5ML syrup Take 200 mg by mouth 3 (three) times daily as needed for cough.    [provider]  meloxicam (MOBIC) 7.5 MG tablet Take 1 tablet (7.5 mg total) by mouth daily as needed for pain. 11/08/14   Clayton Bibles, PA-C  sodium-potassium bicarbonate (ALKA-SELTZER GOLD) TBEF dissolvable tablet Take 1 tablet by mouth daily as needed (for cold symptoms).    [provider]    Family History Family History  Problem Relation Age of Onset  . Colon cancer Paternal Grandmother     Social History Social History   Tobacco Use  . Smoking status: Current Some Day Smoker    Packs/day: 1.00    Years: 51.00    Pack years: 51.00    Types: Cigarettes  . Smokeless tobacco: Never  Used  . Tobacco comment: Counseled that quitting smoking is single most powerful action that she can take to decrease her risk of lung cancer.  Substance Use Topics  . Alcohol use: No    Alcohol/week: 0.0 standard drinks  . Drug use: No     Allergies   Patient has no known allergies.   Review of Systems Review of Systems  HENT: Positive for ear discharge and hearing loss.   All other systems reviewed and are negative.    Physical Exam Triage Vital Signs ED Triage Vitals  Enc Vitals Group     BP 02/24/18 1018 (!) 132/91     Pulse Rate 02/24/18 1018 82     Resp 02/24/18 1018 18     Temp 02/24/18 1018 98.2 F (36.8 C)     Temp src --      SpO2 02/24/18 1018 99 %     Weight 02/24/18 1021 180 lb (81.6 kg)     Height --      Head Circumference --      Peak Flow --      Pain Score 02/24/18 1021 5     Pain Loc --      Pain Edu? --      Excl. in [place]? --    No  data found.  Updated Vital Signs BP (!) 132/91 (BP Location: Left Arm)   Pulse 82   Temp 98.2 F (36.8 C)   Resp 18   Wt 81.6 kg   SpO2 99%   BMI 32.92 kg/m   Visual Acuity Right Eye Distance:   Left Eye Distance:   Bilateral Distance:    Right Eye Near:   Left Eye Near:    Bilateral Near:     Physical Exam Constitutional:      Appearance: Normal appearance.  HENT:     Right Ear: There is impacted cerumen.     Ears:     Comments: Both external canals are occluded by cerumen Neurological:     Mental Status: She is alert.      UC Treatments / Results  Labs (all labs ordered are listed, but only abnormal results are displayed) Labs Reviewed - No data to display  EKG None  Radiology No results found.  Procedures Procedures (including critical care time)  Medications Ordered in UC Medications - No data to display  Initial Impression / Assessment and Plan / UC Course  I have reviewed the triage vital signs and the nursing notes.  Pertinent labs & imaging results that were available  during my care of the patient were reviewed by me and considered in my medical decision making (see chart for details).     Hearing loss secondary to cerumen impaction Final Clinical Impressions(s) / UC Diagnoses   Final diagnoses:  None   Discharge Instructions   None    ED Prescriptions    None     Controlled Substance Prescriptions [place] Controlled Substance Registry consulted? No   Wardell Honour, MD 02/24/18 1125

## 2018-07-23 ENCOUNTER — Encounter: Payer: Self-pay | Admitting: Acute Care

## 2018-10-28 ENCOUNTER — Encounter: Payer: Self-pay | Admitting: Acute Care

## 2018-12-02 ENCOUNTER — Encounter: Payer: Self-pay | Admitting: Acute Care

## 2019-07-03 ENCOUNTER — Other Ambulatory Visit: Payer: Self-pay | Admitting: Internal Medicine

## 2019-07-03 DIAGNOSIS — E2839 Other primary ovarian failure: Secondary | ICD-10-CM

## 2020-08-08 ENCOUNTER — Encounter: Payer: Self-pay | Admitting: Gastroenterology

## 2021-04-05 DIAGNOSIS — Z139 Encounter for screening, unspecified: Secondary | ICD-10-CM

## 2021-04-05 LAB — GLUCOSE, POCT (MANUAL RESULT ENTRY): POC Glucose: 88 mg/dl (ref 70–99)

## 2021-04-12 NOTE — Congregational Nurse Program (Signed)
?  Dept: [phone] ? ? ?Congregational Nurse Program Note ? ?Date of Encounter: 04/05/2021 ? ?Past Medical History: ?Past Medical History:  ?Diagnosis Date  ? Arthritis   ? Vitamin D deficiency   ? ? ?Encounter Details: ? CNP Questionnaire - 04/05/21 1345   ? ?  ? Questionnaire  ? Do you give verbal consent to treat you today? Yes   ? Location Patient Served  Not Applicable   [employer] [employer]  ? Visit Setting Church or Organization   ? Patient Status Unknown   ? Insurance Medicare   ? Insurance Referral N/A   ? Medication N/A   ? Medical Provider Yes   ? Screening Referrals N/A   ? Medical Referral Urgent Care   ? Medical Appointment Made N/A   ? Food N/A   ? Transportation N/A   ? Housing/Utilities N/A   ? Interpersonal Safety N/A   ? Intervention Blood pressure;Blood glucose;Educate;Spiritual Care;Support   ? ED Visit Averted N/A   ? Life-Saving Intervention Made N/A   ? ?  ?  ? ?  ? ? ?Today's Vitals  ? 04/12/21 1345  ?BP: (!) 142/100  ?Pulse: 79  ?Resp: 17  ?Temp: 97.8 ?F (36.6 ?C)  ?SpO2: 97%  ?Weight: 151 lb (68.5 kg)  ?Height: '5\' 2"'$  (1.575 m)  ? ?Body mass index is 27.62 kg/m?.  ? ?Patient given educated concerning patient blood pressure. Rechecked patient blood pressure and it remained the same. Patient stated her blood pressure is elevated and does not take any medication for hypertension. Patient encouraged to report to the urgent care. Patient declined to seek further attention. Patient stated she would go to urgent care the following day. Patient given the importance of lowering blood pressure. Patient able to verbalize understanding of education given. Nurse communicated with the patient if their were any barriers for not going to urgent care. Patient stated she didn't have the time to go today, she had other meeting and work to complete. Patient stated she would follow up with doctor and go the urgent care.  ? ?Andrez Grime, BSN, RN, CRRN,CMSRN   ? ? ?
